# Patient Record
Sex: Female | Born: 1985 | Race: White | Hispanic: No | Smoking: Never smoker
Health system: Southern US, Community
[De-identification: ages and names within clinical notes are randomized; demographics above are authoritative.]

## PROBLEM LIST (undated history)

## (undated) DIAGNOSIS — D649 Anemia, unspecified: Secondary | ICD-10-CM

## (undated) DIAGNOSIS — K589 Irritable bowel syndrome without diarrhea: Secondary | ICD-10-CM

## (undated) DIAGNOSIS — N200 Calculus of kidney: Secondary | ICD-10-CM

## (undated) DIAGNOSIS — E162 Hypoglycemia, unspecified: Secondary | ICD-10-CM

## (undated) HISTORY — PX: CHOLECYSTECTOMY: SHX55

## (undated) HISTORY — PX: TUBAL LIGATION: SHX77

---

## 2004-09-24 ENCOUNTER — Emergency Department: Payer: Self-pay | Admitting: Internal Medicine

## 2004-12-02 ENCOUNTER — Observation Stay: Payer: Self-pay

## 2005-01-29 ENCOUNTER — Observation Stay: Payer: Self-pay | Admitting: Unknown Physician Specialty

## 2005-02-12 ENCOUNTER — Observation Stay: Payer: Self-pay | Admitting: Unknown Physician Specialty

## 2005-03-08 ENCOUNTER — Observation Stay: Payer: Self-pay | Admitting: Obstetrics & Gynecology

## 2005-03-20 ENCOUNTER — Observation Stay: Payer: Self-pay | Admitting: Unknown Physician Specialty

## 2005-03-30 ENCOUNTER — Observation Stay: Payer: Self-pay

## 2005-04-04 ENCOUNTER — Observation Stay: Payer: Self-pay

## 2005-04-09 ENCOUNTER — Observation Stay: Payer: Self-pay | Admitting: Obstetrics & Gynecology

## 2005-04-16 ENCOUNTER — Observation Stay: Payer: Self-pay

## 2005-04-20 ENCOUNTER — Observation Stay: Payer: Self-pay

## 2005-04-21 ENCOUNTER — Observation Stay: Payer: Self-pay

## 2005-04-22 ENCOUNTER — Observation Stay: Payer: Self-pay

## 2005-04-23 ENCOUNTER — Inpatient Hospital Stay: Payer: Self-pay | Admitting: Obstetrics & Gynecology

## 2005-06-28 ENCOUNTER — Emergency Department: Payer: Self-pay | Admitting: Emergency Medicine

## 2005-08-08 ENCOUNTER — Emergency Department: Payer: Self-pay | Admitting: Emergency Medicine

## 2005-09-05 ENCOUNTER — Emergency Department: Payer: Self-pay | Admitting: Emergency Medicine

## 2005-11-20 ENCOUNTER — Ambulatory Visit: Payer: Self-pay | Admitting: Family Medicine

## 2005-12-18 ENCOUNTER — Ambulatory Visit: Payer: Self-pay | Admitting: Family Medicine

## 2005-12-19 ENCOUNTER — Ambulatory Visit (HOSPITAL_COMMUNITY): Admission: RE | Admit: 2005-12-19 | Discharge: 2005-12-19 | Payer: Self-pay | Admitting: Gynecology

## 2005-12-24 ENCOUNTER — Observation Stay: Payer: Self-pay

## 2006-01-15 ENCOUNTER — Ambulatory Visit: Payer: Self-pay | Admitting: Family Medicine

## 2006-01-31 ENCOUNTER — Ambulatory Visit: Payer: Self-pay | Admitting: Obstetrics & Gynecology

## 2006-02-12 ENCOUNTER — Ambulatory Visit: Payer: Self-pay | Admitting: Family Medicine

## 2006-02-26 ENCOUNTER — Ambulatory Visit: Payer: Self-pay | Admitting: Family Medicine

## 2006-03-19 ENCOUNTER — Ambulatory Visit: Payer: Self-pay | Admitting: Obstetrics & Gynecology

## 2006-03-21 ENCOUNTER — Emergency Department: Payer: Self-pay | Admitting: Emergency Medicine

## 2006-03-31 ENCOUNTER — Ambulatory Visit: Payer: Self-pay | Admitting: Obstetrics and Gynecology

## 2006-03-31 ENCOUNTER — Inpatient Hospital Stay (HOSPITAL_COMMUNITY): Admission: AD | Admit: 2006-03-31 | Discharge: 2006-03-31 | Payer: Self-pay | Admitting: Gynecology

## 2006-04-02 ENCOUNTER — Ambulatory Visit: Payer: Self-pay | Admitting: Family Medicine

## 2006-04-11 ENCOUNTER — Observation Stay: Payer: Self-pay | Admitting: Unknown Physician Specialty

## 2006-04-15 ENCOUNTER — Ambulatory Visit: Payer: Self-pay | Admitting: Certified Nurse Midwife

## 2006-04-15 ENCOUNTER — Inpatient Hospital Stay (HOSPITAL_COMMUNITY): Admission: AD | Admit: 2006-04-15 | Discharge: 2006-04-15 | Payer: Self-pay | Admitting: Obstetrics & Gynecology

## 2006-04-16 ENCOUNTER — Ambulatory Visit: Payer: Self-pay | Admitting: Obstetrics & Gynecology

## 2006-04-23 ENCOUNTER — Ambulatory Visit: Payer: Self-pay | Admitting: Gynecology

## 2006-04-30 ENCOUNTER — Ambulatory Visit: Payer: Self-pay | Admitting: Family Medicine

## 2006-05-02 ENCOUNTER — Ambulatory Visit: Payer: Self-pay | Admitting: Family Medicine

## 2006-05-06 ENCOUNTER — Inpatient Hospital Stay (HOSPITAL_COMMUNITY): Admission: AD | Admit: 2006-05-06 | Discharge: 2006-05-06 | Payer: Self-pay | Admitting: Obstetrics & Gynecology

## 2006-05-07 ENCOUNTER — Ambulatory Visit: Payer: Self-pay | Admitting: Family Medicine

## 2006-05-13 ENCOUNTER — Inpatient Hospital Stay (HOSPITAL_COMMUNITY): Admission: AD | Admit: 2006-05-13 | Discharge: 2006-05-13 | Payer: Self-pay | Admitting: Obstetrics and Gynecology

## 2006-05-14 ENCOUNTER — Ambulatory Visit: Payer: Self-pay | Admitting: *Deleted

## 2006-05-14 ENCOUNTER — Inpatient Hospital Stay (HOSPITAL_COMMUNITY): Admission: AD | Admit: 2006-05-14 | Discharge: 2006-05-16 | Payer: Self-pay | Admitting: Obstetrics and Gynecology

## 2006-08-18 ENCOUNTER — Emergency Department: Payer: Self-pay | Admitting: Emergency Medicine

## 2006-10-04 ENCOUNTER — Emergency Department: Payer: Self-pay | Admitting: Emergency Medicine

## 2006-10-11 ENCOUNTER — Encounter (INDEPENDENT_AMBULATORY_CARE_PROVIDER_SITE_OTHER): Payer: Self-pay | Admitting: Specialist

## 2006-10-11 ENCOUNTER — Inpatient Hospital Stay (HOSPITAL_COMMUNITY): Admission: EM | Admit: 2006-10-11 | Discharge: 2006-10-12 | Payer: Self-pay | Admitting: Emergency Medicine

## 2007-01-06 ENCOUNTER — Inpatient Hospital Stay: Payer: Self-pay | Admitting: Psychiatry

## 2007-01-22 ENCOUNTER — Emergency Department: Payer: Self-pay | Admitting: Emergency Medicine

## 2007-02-15 ENCOUNTER — Emergency Department: Payer: Self-pay | Admitting: Emergency Medicine

## 2007-03-11 ENCOUNTER — Inpatient Hospital Stay (HOSPITAL_COMMUNITY): Admission: AD | Admit: 2007-03-11 | Discharge: 2007-03-18 | Payer: Self-pay | Admitting: *Deleted

## 2007-03-11 ENCOUNTER — Ambulatory Visit: Payer: Self-pay | Admitting: *Deleted

## 2007-05-26 ENCOUNTER — Emergency Department: Payer: Self-pay | Admitting: Emergency Medicine

## 2007-05-26 ENCOUNTER — Other Ambulatory Visit: Payer: Self-pay

## 2007-08-20 ENCOUNTER — Emergency Department: Payer: Self-pay | Admitting: Emergency Medicine

## 2007-08-20 ENCOUNTER — Other Ambulatory Visit: Payer: Self-pay

## 2007-12-05 ENCOUNTER — Emergency Department: Payer: Self-pay | Admitting: Emergency Medicine

## 2008-03-12 ENCOUNTER — Inpatient Hospital Stay: Payer: Self-pay | Admitting: Psychiatry

## 2008-07-19 ENCOUNTER — Emergency Department: Payer: Self-pay | Admitting: Emergency Medicine

## 2008-08-20 ENCOUNTER — Inpatient Hospital Stay: Payer: Self-pay | Admitting: Psychiatry

## 2008-10-29 ENCOUNTER — Inpatient Hospital Stay: Payer: Self-pay | Admitting: Unknown Physician Specialty

## 2008-12-05 ENCOUNTER — Emergency Department: Payer: Self-pay | Admitting: Emergency Medicine

## 2008-12-24 ENCOUNTER — Emergency Department: Payer: Self-pay | Admitting: Emergency Medicine

## 2009-02-28 ENCOUNTER — Encounter: Payer: Self-pay | Admitting: Obstetrics & Gynecology

## 2009-03-15 ENCOUNTER — Encounter: Payer: Self-pay | Admitting: Pediatric Cardiology

## 2009-04-14 ENCOUNTER — Observation Stay: Payer: Self-pay | Admitting: Obstetrics and Gynecology

## 2009-04-26 ENCOUNTER — Encounter: Payer: Self-pay | Admitting: Pediatric Cardiology

## 2009-05-12 ENCOUNTER — Emergency Department: Payer: Self-pay | Admitting: Emergency Medicine

## 2009-06-21 ENCOUNTER — Observation Stay: Payer: Self-pay | Admitting: Obstetrics and Gynecology

## 2009-07-05 ENCOUNTER — Observation Stay: Payer: Self-pay | Admitting: Obstetrics and Gynecology

## 2009-07-08 ENCOUNTER — Observation Stay: Payer: Self-pay | Admitting: Obstetrics and Gynecology

## 2009-07-11 ENCOUNTER — Observation Stay: Payer: Self-pay | Admitting: Obstetrics and Gynecology

## 2009-07-12 ENCOUNTER — Observation Stay: Payer: Self-pay | Admitting: Obstetrics and Gynecology

## 2009-07-13 ENCOUNTER — Inpatient Hospital Stay: Payer: Self-pay | Admitting: Obstetrics and Gynecology

## 2009-09-15 ENCOUNTER — Emergency Department: Payer: Self-pay | Admitting: Internal Medicine

## 2009-10-12 ENCOUNTER — Emergency Department: Payer: Self-pay | Admitting: Emergency Medicine

## 2010-03-11 ENCOUNTER — Emergency Department: Payer: Self-pay | Admitting: Internal Medicine

## 2010-09-19 ENCOUNTER — Emergency Department: Payer: Self-pay | Admitting: Emergency Medicine

## 2010-10-23 ENCOUNTER — Emergency Department: Payer: Self-pay | Admitting: Emergency Medicine

## 2011-01-26 NOTE — Op Note (Signed)
NAMEAALIA, GREULICH NO.:  [id number]   MEDICAL RECORD NO.:  [id number]          PATIENT TYPE:  INP   LOCATION:  5011                         FACILITY:  [facility]   PHYSICIAN:  Leonie Man, M.D.   DATE OF BIRTH:  15-Jan-1986   DATE OF PROCEDURE:  10/11/2006  DATE OF DISCHARGE:                               OPERATIVE REPORT   PREOPERATIVE DIAGNOSES:  Biliary colic and cholelithiasis.   POSTOPERATIVE DIAGNOSES:  Biliary colic and cholelithiasis.   PROCEDURE:  Laparoscopic cholecystectomy with intraoperative  cholangiogram.   SURGEON:  Leonie Man, M.D.   ASSISTANT:  Wilmon Arms. Tsuei, M.D.   ANESTHESIA:  General.   SPECIMENS TO THE LAB:  Gallbladder with stones.   ESTIMATED BLOOD LOSS:  Minimal.   COMPLICATIONS:  The patient returned to the PACU in excellent condition.   BRIEF CLINICAL NOTE:  The patient is a 25 year old white female admitted  through the emergency room, complaining of severe epigastric and right  upper quadrant pain with nausea -- but without emesis.  She was  scheduled for laparoscopic cholecystectomy in [place] next Tuesday;  however, the past several days she had been having persistent epigastric  pain and wishes to have something done immediately.  On evaluation a  ultrasound examination faxed from [facility] shows the cholelithiasis  without evidence of gallbladder wall thickening or pericholecystic  fluid.  There was no evidence of biliary distension.  Her vital signs  were stable.  Laboratory evaluations were all within normal limits.  The  risks and potential benefits of surgery were described to the patient.  She understands and gives her consent to same.   PROCEDURE:  The patient was positioned supinely, following the  administration of general endotracheal anesthesia.  The abdomen was  prepped and draped to be included in a sterile operative field.  Open  laparoscopy was created at the umbilicus, with insertion of a  Hassan  cannula.  The peritoneum was inflated to 15 mmHg pressure using carbon  dioxide.  The camera was inserted and the visual exploration carried  out.  Upon inserting the camera the gallbladder appeared to be otherwise  normal, except for a stone within the ampulla  The liver edges were  somewhat rounded.  The liver surfaces were smooth.  There was some  scarring around the duodenum and hepatoduodenal ligament; there was also  some scarring in the pelvis.  Under direct vision, epigastric and  lateral ports were placed.  The gallbladder was grasped and retracted  cephalad.  Dissection the region of the ampulla revealed cystic artery  and cystic duct in the normal anatomic locations.  They were both traced  up to the entry into the gallbladder wall.  The cystic duct was clipped  proximally and opened.  A cystic duct cholangiogram was carried out  using half-strength Hypaque under fluoroscopic control.  The resulting  cholangiogram was normal with no filling defects or free flow of  contrast into the duodenum; normal caliber ducts.  Cholangiocatheter was  removed.  The cystic duct was triply clipped and transected.  The cystic  artery was isolated, doubly clipped  and transected.  The gallbladder was  dissected free from the liver bed using electrocautery, and maintained  hemostasis throughout the course of the dissection.  At the end of the  dissection the liver bed was inspected.  Additional bleeding points were  treated with electrocautery.  The right upper quadrant was thoroughly  irrigated with normal saline.  The gallbladder was placed in a retrieval  pouch and retrieved through the umbilical wound without difficulty.  All  trocars removed under direct vision.  The pneumoperitoneum was released  and wounds closed in layers as follows:  umbilical wound with 0 Dexon  and 4-0 Monocryl; epigastric and flank wounds closed with 4-0 Monocryl.  All wounds were reinforced with Steri-Strips.   Sterile dressings were  applied.  Anesthetic was reversed.  The patient went to the recovery  room in stable condition.  She tolerated the procedure well.      Leonie Man, M.D.  Electronically Signed     PB/MEDQ  D:  10/11/2006  T:  10/11/2006  Job:  [id number]
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
## 2011-03-28 ENCOUNTER — Emergency Department: Payer: Self-pay | Admitting: Emergency Medicine

## 2011-05-12 ENCOUNTER — Emergency Department: Payer: Self-pay | Admitting: Emergency Medicine

## 2011-05-31 ENCOUNTER — Emergency Department: Payer: Self-pay | Admitting: Emergency Medicine

## 2011-06-26 LAB — URINALYSIS, ROUTINE W REFLEX MICROSCOPIC
Bilirubin Urine: NEGATIVE
Glucose, UA: NEGATIVE
Hgb urine dipstick: NEGATIVE
Ketones, ur: NEGATIVE
Protein, ur: NEGATIVE
Specific Gravity, Urine: 1.016
Urobilinogen, UA: 0.2

## 2011-06-26 LAB — CBC: RBC: 4.72

## 2011-06-26 LAB — COMPREHENSIVE METABOLIC PANEL
AST: 16
Albumin: 3.7
Alkaline Phosphatase: 59
Calcium: 9.5
Chloride: 104
Creatinine, Ser: 0.74
Glucose, Bld: 94
Potassium: 3.9
Total Protein: 7.1

## 2011-06-26 LAB — DRUGS OF ABUSE SCREEN W/O ALC, ROUTINE URINE
Amphetamine Screen, Ur: NEGATIVE
Cocaine Metabolites: NEGATIVE
Marijuana Metabolite: NEGATIVE

## 2011-06-26 LAB — TSH: TSH: 1.522

## 2011-08-09 ENCOUNTER — Inpatient Hospital Stay: Payer: Self-pay | Admitting: Psychiatry

## 2011-10-01 ENCOUNTER — Emergency Department: Payer: Self-pay | Admitting: Emergency Medicine

## 2011-10-01 LAB — COMPREHENSIVE METABOLIC PANEL
Albumin: 3.7 g/dL (ref 3.4–5.0)
Alkaline Phosphatase: 46 U/L — ABNORMAL LOW (ref 50–136)
Calcium, Total: 8.5 mg/dL (ref 8.5–10.1)
Co2: 27 mmol/L (ref 21–32)
EGFR (Non-African Amer.): >60
Glucose: 104 mg/dL — ABNORMAL HIGH (ref 65–99)
Osmolality: 290 (ref 275–301)
SGPT (ALT): 19 U/L
Sodium: 146 mmol/L — ABNORMAL HIGH (ref 136–145)

## 2011-10-01 LAB — URINALYSIS, COMPLETE
Bacteria: NONE SEEN
Ketone: NEGATIVE
Nitrite: NEGATIVE
Ph: 6 (ref 4.5–8.0)
Protein: NEGATIVE
Specific Gravity: 1.014 (ref 1.003–1.030)
Squamous Epithelial: 4

## 2011-10-01 LAB — CBC
HCT: 35.9 % (ref 35.0–47.0)
MCHC: 33.9 g/dL (ref 32.0–36.0)
Platelet: 183 10*3/uL (ref 150–440)
RDW: 13.6 % (ref 11.5–14.5)
WBC: 6.7 10*3/uL (ref 3.6–11.0)

## 2011-12-07 ENCOUNTER — Emergency Department: Payer: Self-pay | Admitting: Emergency Medicine

## 2011-12-07 LAB — COMPREHENSIVE METABOLIC PANEL
Alkaline Phosphatase: 59 U/L (ref 50–136)
Bilirubin,Total: 0.4 mg/dL (ref 0.2–1.0)
Calcium, Total: 8.8 mg/dL (ref 8.5–10.1)
Co2: 25 mmol/L (ref 21–32)
EGFR (Non-African Amer.): >60
Glucose: 88 mg/dL (ref 65–99)
Osmolality: 282 (ref 275–301)
SGOT(AST): 19 U/L (ref 15–37)
SGPT (ALT): 19 U/L
Sodium: 142 mmol/L (ref 136–145)
Total Protein: 7.4 g/dL (ref 6.4–8.2)

## 2011-12-07 LAB — URINALYSIS, COMPLETE
Bilirubin,UR: NEGATIVE
Blood: NEGATIVE
Glucose,UR: NEGATIVE mg/dL (ref 0–75)
Leukocyte Esterase: NEGATIVE
Nitrite: NEGATIVE
Protein: NEGATIVE
RBC,UR: 2 /HPF (ref 0–5)
Specific Gravity: 1.014 (ref 1.003–1.030)
Squamous Epithelial: 3
WBC UR: 4 /HPF (ref 0–5)

## 2011-12-07 LAB — CBC
MCH: 28.5 pg (ref 26.0–34.0)
MCHC: 33.4 g/dL (ref 32.0–36.0)
MCV: 85 fL (ref 80–100)
Platelet: 216 10*3/uL (ref 150–440)
RBC: 4.55 10*6/uL (ref 3.80–5.20)
RDW: 12.7 % (ref 11.5–14.5)

## 2011-12-07 LAB — DRUG SCREEN, URINE
Amphetamines, Ur Screen: NEGATIVE (ref ?–1000)
Benzodiazepine, Ur Scrn: NEGATIVE (ref ?–200)
Cannabinoid 50 Ng, Ur ~~LOC~~: NEGATIVE (ref ?–50)
Cocaine Metabolite,Ur ~~LOC~~: NEGATIVE (ref ?–300)
Methadone, Ur Screen: NEGATIVE (ref ?–300)
Opiate, Ur Screen: NEGATIVE (ref ?–300)
Phencyclidine (PCP) Ur S: NEGATIVE (ref ?–25)

## 2011-12-07 LAB — TROPONIN I: Troponin-I: <0.02 ng/mL

## 2011-12-07 LAB — LITHIUM LEVEL: Lithium: <0.2 mmol/L — ABNORMAL LOW

## 2012-01-15 ENCOUNTER — Inpatient Hospital Stay: Payer: Self-pay | Admitting: Internal Medicine

## 2012-01-15 LAB — DRUG SCREEN, URINE
Amphetamines, Ur Screen: NEGATIVE (ref ?–1000)
Barbiturates, Ur Screen: NEGATIVE (ref ?–200)
Cannabinoid 50 Ng, Ur ~~LOC~~: NEGATIVE (ref ?–50)
Cocaine Metabolite,Ur ~~LOC~~: NEGATIVE (ref ?–300)
MDMA (Ecstasy)Ur Screen: NEGATIVE (ref ?–500)
Tricyclic, Ur Screen: NEGATIVE (ref ?–1000)

## 2012-01-15 LAB — SALICYLATE LEVEL: Salicylates, Serum: <1.7 mg/dL

## 2012-01-15 LAB — URINALYSIS, COMPLETE
Bacteria: NONE SEEN
Blood: NEGATIVE
Ketone: NEGATIVE
Leukocyte Esterase: NEGATIVE
Protein: NEGATIVE

## 2012-01-15 LAB — COMPREHENSIVE METABOLIC PANEL
Albumin: 3.9 g/dL (ref 3.4–5.0)
Alkaline Phosphatase: 54 U/L (ref 50–136)
BUN: 12 mg/dL (ref 7–18)
Bilirubin,Total: 0.3 mg/dL (ref 0.2–1.0)
Calcium, Total: 8.5 mg/dL (ref 8.5–10.1)
Chloride: 109 mmol/L — ABNORMAL HIGH (ref 98–107)
Creatinine: 0.58 mg/dL — ABNORMAL LOW (ref 0.60–1.30)
EGFR (African American): >60
Potassium: 3.8 mmol/L (ref 3.5–5.1)
Sodium: 141 mmol/L (ref 136–145)

## 2012-01-15 LAB — BASIC METABOLIC PANEL
Anion Gap: 5 — ABNORMAL LOW (ref 7–16)
BUN: 6 mg/dL — ABNORMAL LOW (ref 7–18)
Co2: 27 mmol/L (ref 21–32)
Potassium: 4.3 mmol/L (ref 3.5–5.1)

## 2012-01-15 LAB — CBC
MCV: 84 fL (ref 80–100)
RDW: 13.5 % (ref 11.5–14.5)

## 2012-01-15 LAB — ETHANOL
Ethanol %: <0.003 % (ref 0.000–0.080)
Ethanol: <3 mg/dL

## 2012-01-15 LAB — LITHIUM LEVEL
Lithium: 1.39 mmol/L — ABNORMAL HIGH
Lithium: 0.74 mmol/L

## 2012-01-16 ENCOUNTER — Inpatient Hospital Stay: Payer: Self-pay | Admitting: Psychiatry

## 2012-01-17 LAB — BEHAVIORAL MEDICINE 1 PANEL
BUN: 13 mg/dL (ref 7–18)
Bilirubin,Total: 0.4 mg/dL (ref 0.2–1.0)
Co2: 27 mmol/L (ref 21–32)
Creatinine: 0.76 mg/dL (ref 0.60–1.30)
Glucose: 85 mg/dL (ref 65–99)
Lymphocyte #: 2.2 10*3/uL (ref 1.0–3.6)
Lymphocyte %: 26.4 %
Monocyte #: 0.6 x10 3/mm (ref 0.2–0.9)
Neutrophil %: 61.6 %
SGPT (ALT): 15 U/L
Sodium: 142 mmol/L (ref 136–145)
Total Protein: 7.3 g/dL (ref 6.4–8.2)

## 2012-01-20 LAB — LIPID PANEL
Cholesterol: 91 mg/dL (ref 0–200)
HDL Cholesterol: 27 mg/dL — ABNORMAL LOW (ref 40–60)
Triglycerides: 50 mg/dL (ref 0–200)

## 2012-01-21 LAB — COMPREHENSIVE METABOLIC PANEL
Albumin: 3.6 g/dL (ref 3.4–5.0)
Calcium, Total: 8.6 mg/dL (ref 8.5–10.1)
Co2: 27 mmol/L (ref 21–32)
Creatinine: 0.67 mg/dL (ref 0.60–1.30)
EGFR (African American): >60
EGFR (Non-African Amer.): >60
Osmolality: 278 (ref 275–301)
Potassium: 3.9 mmol/L (ref 3.5–5.1)
SGOT(AST): 12 U/L — ABNORMAL LOW (ref 15–37)
SGPT (ALT): 15 U/L

## 2012-02-04 ENCOUNTER — Emergency Department: Payer: Self-pay | Admitting: Emergency Medicine

## 2012-02-08 ENCOUNTER — Emergency Department: Payer: Self-pay | Admitting: Internal Medicine

## 2012-02-22 ENCOUNTER — Emergency Department: Payer: Self-pay | Admitting: Emergency Medicine

## 2012-02-22 LAB — HCG, QUANTITATIVE, PREGNANCY: Beta Hcg, Quant.: <1 m[IU]/mL — ABNORMAL LOW

## 2012-02-22 LAB — WET PREP, GENITAL

## 2012-10-13 ENCOUNTER — Emergency Department: Payer: Self-pay | Admitting: Unknown Physician Specialty

## 2012-10-13 LAB — RAPID INFLUENZA A&B ANTIGENS

## 2012-10-13 LAB — COMPREHENSIVE METABOLIC PANEL
Bilirubin,Total: 0.2 mg/dL (ref 0.2–1.0)
Creatinine: 0.77 mg/dL (ref 0.60–1.30)
EGFR (African American): >60
EGFR (Non-African Amer.): >60
Osmolality: 279 (ref 275–301)
SGOT(AST): 17 U/L (ref 15–37)
Sodium: 140 mmol/L (ref 136–145)

## 2012-10-13 LAB — CBC
HCT: 36.7 % (ref 35.0–47.0)
HGB: 12.2 g/dL (ref 12.0–16.0)
MCHC: 33.2 g/dL (ref 32.0–36.0)
MCV: 84 fL (ref 80–100)
Platelet: 213 10*3/uL (ref 150–440)
RBC: 4.39 10*6/uL (ref 3.80–5.20)
RDW: 13.5 % (ref 11.5–14.5)

## 2012-10-13 LAB — URINALYSIS, COMPLETE
Bacteria: NONE SEEN
Blood: NEGATIVE
Glucose,UR: NEGATIVE mg/dL (ref 0–75)
Ketone: NEGATIVE
Leukocyte Esterase: NEGATIVE
Ph: 8 (ref 4.5–8.0)
RBC,UR: <1 /HPF (ref 0–5)
Squamous Epithelial: 2
WBC UR: <1 /HPF (ref 0–5)

## 2012-11-08 ENCOUNTER — Emergency Department: Payer: Self-pay | Admitting: Emergency Medicine

## 2012-11-20 ENCOUNTER — Emergency Department: Payer: Self-pay | Admitting: Emergency Medicine

## 2012-11-20 LAB — COMPREHENSIVE METABOLIC PANEL
Anion Gap: 8 (ref 7–16)
Bilirubin,Total: 0.2 mg/dL (ref 0.2–1.0)
Calcium, Total: 8.4 mg/dL — ABNORMAL LOW (ref 8.5–10.1)
Chloride: 107 mmol/L (ref 98–107)
Co2: 24 mmol/L (ref 21–32)
Creatinine: 0.71 mg/dL (ref 0.60–1.30)
EGFR (African American): >60
EGFR (Non-African Amer.): >60
Glucose: 88 mg/dL (ref 65–99)
Osmolality: 277 (ref 275–301)
Potassium: 3.8 mmol/L (ref 3.5–5.1)
Sodium: 139 mmol/L (ref 136–145)
Total Protein: 7.2 g/dL (ref 6.4–8.2)

## 2012-11-20 LAB — CBC WITH DIFFERENTIAL/PLATELET
Basophil #: 0.1 10*3/uL (ref 0.0–0.1)
HGB: 12.4 g/dL (ref 12.0–16.0)
Lymphocyte #: 1.9 10*3/uL (ref 1.0–3.6)
Lymphocyte %: 27.3 %
MCH: 27.4 pg (ref 26.0–34.0)
MCV: 83 fL (ref 80–100)
Monocyte #: 0.5 x10 3/mm (ref 0.2–0.9)
Monocyte %: 7.6 %
Neutrophil #: 4.3 10*3/uL (ref 1.4–6.5)
Neutrophil %: 62.4 %
RDW: 13.2 % (ref 11.5–14.5)

## 2012-11-20 LAB — URINALYSIS, COMPLETE
Nitrite: NEGATIVE
Ph: 6 (ref 4.5–8.0)
Protein: NEGATIVE
Squamous Epithelial: 5
WBC UR: 1 /HPF (ref 0–5)

## 2013-10-06 ENCOUNTER — Emergency Department: Payer: Self-pay | Admitting: Emergency Medicine

## 2014-11-07 ENCOUNTER — Emergency Department: Payer: Self-pay | Admitting: Emergency Medicine

## 2015-01-02 NOTE — Discharge Summary (Signed)
PATIENT NAME:  Alexis [name], Alexis [name] MR#:  [id number] DATE OF BIRTH:  December 15, 1985  DATE OF ADMISSION:  01/15/2012 DATE OF DISCHARGE:  01/16/2012   [facility]   PRIMARY DOCTOR: None.   DISCHARGE DIAGNOSES:  1. Multidrug overdose with suicidal attempt. 2. Lithium toxicity. 3. Depression.  4. Gastroesophageal reflux disease. 5. Migraine headaches.   MEDICATIONS: Right now she is only on IV fluids and no other medications. The medications that are to be restarted will be decided by psychiatrist.   CONSULTATION: Psychiatry with Dr. Arlyss RepressJames Williford    HOSPITAL COURSE: The patient is [name] 29 year old female with history of depression before on Effexor, Klonopin, lithium, and Seroquel at home who was brought in by roommate because of concern for overdose. The patient might have taken 30 tablets of Effexor, 30 tablets of Klonopin, and also 5 to 10 tablets of lithium. Effexor tablet strength is 75 mg daily, Klonopin 0.5 mg, and lithium 5 to 10 tablets; each tablet is 200 mg. The patient acknowledged that she was trying to kill herself and was taking overdose on and off for several months. The patient was admitted to the hospitalist service in Critical Care Unit because her lithium level was 1.39 on admission and Poison Control Center recommended to monitor her in ICU. She was started on IV fluids along with IVC and one-to-one precautions. Her EKG was normal sinus rhythm, no ST-T changes. Urine toxicology was negative. Her creatinine was 0.5 when she came. The patient received charcoal in the Emergency Room and had an NG tube placed and transferred to ICU. In ICU she received IV fluids and became more alert. NG tube was discontinued and she was started on diet. Initially she was on liquids and we advanced her diet to [name] regular diet last evening. The patient tolerated the diet and her repeat level of lithium was slightly elevated at 1.43 on second level  and then the repeat was 0.74. That was the third lithium level. The patient was maintained on seizure precautions for 24 hours because she also overdosed on Effexor. The patient did not have any seizures. She was calm and oriented. The patient's condition is stable. She is still having some suicidal thoughts. She has [name] Comptrollersitter at the bedside and she is medically cleared to admit for inpatient psych evaluation. Hold on psychotropic medications and follow recommendations of psychiatrist.  VITALS AT THIS TIME: Temperature 98.5, pulse 55, respirations 17, blood pressure this morning 110/57, sats 98%.   OTHER LAB DATA: Her EKG on admission normal sinus, 69 beats per minute. Lithium 1.39. TSH 1.95. Salicylates less than 1.7. WBC 6.6, hemoglobin 12.6, hematocrit 37.4, platelets 185. Electrolytes sodium 141, potassium 3.8, chloride 109, bicarb 26, BUN 12, creatinine 0.58, glucose 93. Liver functions within normal limits. ABG pH 7.36, pCO2 44, pO2 158, sats 99.4%. Pregnancy test negative. The patient's urinalysis is clean. No leukocyte esterase. No bacteria. Urine toxicology is negative. Chest x-ray showed no acute disease. Lithium level repeat was 1.43 and third lithium level 0.74.   DISPOSITION: The patient is stable for transfer to Behavioral Medicine Unit.  TIME SPENT ON DISCHARGE PREPARATION: More than 30 minutes.  ____________________________ Katha HammingSnehalatha Lynann Demetrius, MD sk:drc D: 01/16/2012 09:29:46 ET T: 01/16/2012 13:43:53 ET JOB#: [id number]  cc: Katha HammingSnehalatha Almena Hokenson, MD, <Dictator> Katha HammingSNEHALATHA Leray Garverick MD ELECTRONICALLY SIGNED 01/23/2012 22:54
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
## 2015-05-30 ENCOUNTER — Encounter: Payer: Self-pay | Admitting: Emergency Medicine

## 2015-05-30 ENCOUNTER — Emergency Department
Admission: EM | Admit: 2015-05-30 | Discharge: 2015-05-30 | Disposition: A | Discharge status: Home / Self Care | Payer: Self-pay | Attending: Emergency Medicine | Admitting: Emergency Medicine

## 2015-05-30 DIAGNOSIS — N390 Urinary tract infection, site not specified: Secondary | ICD-10-CM | POA: Insufficient documentation

## 2015-05-30 LAB — URINALYSIS COMPLETE WITH MICROSCOPIC (ARMC ONLY)
BILIRUBIN URINE: NEGATIVE
GLUCOSE, UA: NEGATIVE mg/dL
Ketones, ur: NEGATIVE mg/dL
NITRITE: NEGATIVE
Protein, ur: 100 mg/dL — AB
SPECIFIC GRAVITY, URINE: 1.025 (ref 1.005–1.030)
Trans Epithel, UA: 2
pH: 5 (ref 5.0–8.0)

## 2015-05-30 MED ORDER — CIPROFLOXACIN HCL 500 MG PO TABS: 500.0000 mg | ORAL_TABLET | Freq: Two times a day (BID) | ORAL | Status: DC

## 2015-05-30 NOTE — Discharge Instructions (Signed)

## 2015-05-30 NOTE — ED Provider Notes (Signed)
[facility] Emergency Department Provider Note  ____________________________________________  Time seen: On arrival  I have reviewed the triage vital signs and the nursing notes.   HISTORY  Chief Complaint Urinary Frequency    HPI Alexis [name] is a 29 y.o. female who presents with complaints of urinary frequency, dysuria for approximately 24 hours. She has tried treating for yeast infection with no improvement. She denies vaginal discharge. She is recent check for STDs as negative.She denies fevers chills. No flank pain. No pelvic discomfort except when urinating    History reviewed. No pertinent past medical history.  There are no active problems to display for this patient.   History reviewed. No pertinent past surgical history.  Current Outpatient Rx  Name  Route  Sig  Dispense  Refill  . ciprofloxacin (CIPRO) 500 MG tablet   Oral   Take 1 tablet (500 mg total) by mouth 2 (two) times daily.   14 tablet   0     Allergies Morphine and related  History reviewed. No pertinent family history.  Social History Social History  Substance Use Topics  . Smoking status: Never Smoker   . Smokeless tobacco: None  . Alcohol Use: None    Review of Systems  Constitutional: Negative for fever. Eyes: Negative for discharge ENT: Negative for sore throat Genitourinary: Positive for dysuria Musculoskeletal: Negative for back pain. Skin: Negative for rash. Neurological: Negative for headaches    ____________________________________________   PHYSICAL EXAM:  VITAL SIGNS: ED Triage Vitals  Enc Vitals Group     BP 05/30/15 0928 110/62 mmHg     Pulse Rate 05/30/15 0928 74     Resp 05/30/15 0928 18     Temp 05/30/15 0928 97.9 F (36.6 C)     Temp Source 05/30/15 0928 Oral     SpO2 05/30/15 0928 99 %     Weight 05/30/15 1023 170 lb (77.111 kg)     Height 05/30/15 1023  (1.626 m)     Head Cir --      Peak Flow --      Pain  Score 05/30/15 0920 5     Pain Loc --      Pain Edu? --      Excl. in GC? --      Constitutional: Alert and oriented. Well appearing and in no distress. Eyes: Conjunctivae are normal.  ENT   Head: Normocephalic and atraumatic.   Mouth/Throat: Mucous membranes are moist. Cardiovascular: Normal rate, regular rhythm.  Respiratory: Normal respiratory effort without tachypnea nor retractions.  Gastrointestinal: Soft and non-tender in all quadrants. No distention. There is no CVA tenderness. Musculoskeletal: Nontender with normal range of motion in all extremities. Neurologic:  Normal speech and language. No gross focal neurologic deficits are appreciated. Skin:  Skin is warm, dry and intact. No rash noted. Psychiatric: Mood and affect are normal. Patient exhibits appropriate insight and judgment.  ____________________________________________    LABS (pertinent positives/negatives)  Labs Reviewed  URINALYSIS COMPLETEWITH MICROSCOPIC ([facility] ONLY) - Abnormal; Notable for the following:    Color, Urine YELLOW (*)    APPearance CLOUDY (*)    Hgb urine dipstick 3+ (*)    Protein, ur 100 (*)    Leukocytes, UA 3+ (*)    Bacteria, UA FEW (*)    Squamous Epithelial / LPF 6-30 (*)    All other components within normal limits    ____________________________________________     ____________________________________________    RADIOLOGY I have personally reviewed  any xrays that were ordered on this patient: None  ____________________________________________   PROCEDURES  Procedure(s) performed: none   ____________________________________________   INITIAL IMPRESSION / ASSESSMENT AND PLAN / ED COURSE  Pertinent labs & imaging results that were available during my care of the patient were reviewed by me and considered in my medical decision making (see chart for details).  Patient well-appearing. Vitals are normal. Afebrile. Benign exam. History of present illness  most consistent with urinary tract infection. UA c/w UTI. No flank pain We'll check urine and if positive will treat with antibiotics  ____________________________________________   FINAL CLINICAL IMPRESSION(S) / ED DIAGNOSES  Final diagnoses:  UTI (lower urinary tract infection)     Jene Every, MD 05/30/15 1037

## 2015-05-30 NOTE — ED Notes (Signed)
Reports burning with urination and urinary frequency

## 2015-09-01 ENCOUNTER — Encounter: Payer: Self-pay | Admitting: Emergency Medicine

## 2015-09-01 ENCOUNTER — Emergency Department
Admission: EM | Admit: 2015-09-01 | Discharge: 2015-09-01 | Disposition: A | Discharge status: Home / Self Care | Payer: Self-pay | Attending: Student | Admitting: Student

## 2015-09-01 DIAGNOSIS — B349 Viral infection, unspecified: Secondary | ICD-10-CM | POA: Insufficient documentation

## 2015-09-01 DIAGNOSIS — Z792 Long term (current) use of antibiotics: Secondary | ICD-10-CM | POA: Insufficient documentation

## 2015-09-01 NOTE — ED Provider Notes (Signed)
[facility] Emergency Department Provider Note  ____________________________________________  Time seen: Approximately 7:07 AM  I have reviewed the triage vital signs and the nursing notes.   HISTORY  Chief Complaint Generalized Body Aches and Chills    HPI Alexis [name] is a 29 y.o. female with no chronic medical problems who presents for evaluation of one day runny nose, nausea, chills, gradual onset, intermittent, currently mild, no modifying factors. Patient reports that yesterday she developed some runny nose. Throughout the night she developed chills and nausea. She did not check her temperature so she cannot attest to fevers. She's had no vomiting or diarrhea, no chest pain or abdominal pain. No pain or burning with urination. No headache, no neck pain or neck stiffness.   History reviewed. No pertinent past medical history.  There are no active problems to display for this patient.   Past Surgical History  Procedure Laterality Date  . Tubal ligation    . Cholecystectomy      Current Outpatient Rx  Name  Route  Sig  Dispense  Refill  . ciprofloxacin (CIPRO) 500 MG tablet   Oral   Take 1 tablet (500 mg total) by mouth 2 (two) times daily.   14 tablet   0     Allergies Morphine and related  No family history on file.  Social History Social History  Substance Use Topics  . Smoking status: Never Smoker   . Smokeless tobacco: None  . Alcohol Use: No    Review of Systems Constitutional: No fever, +chills Eyes: No visual changes. ENT: No sore throat. Cardiovascular: Denies chest pain. Respiratory: Denies shortness of breath. Gastrointestinal: No abdominal pain.  + nausea, no vomiting.  No diarrhea.  No constipation. Genitourinary: Negative for dysuria. Musculoskeletal: Negative for back pain. Skin: Negative for rash. Neurological: Negative for headaches, focal weakness or numbness.  10-point ROS otherwise  negative.  ____________________________________________   PHYSICAL EXAM:  VITAL SIGNS: ED Triage Vitals  Enc Vitals Group     BP 09/01/15 0536 116/60 mmHg     Pulse Rate 09/01/15 0536 69     Resp 09/01/15 0536 18     Temp 09/01/15 0536 98.3 F (36.8 C)     Temp Source 09/01/15 0536 Oral     SpO2 09/01/15 0536 99 %     Weight 09/01/15 0536 175 lb (79.379 kg)     Height 09/01/15 0536 5\' 9"  (1.753 m)     Head Cir --      Peak Flow --      Pain Score 09/01/15 0535 3     Pain Loc --      Pain Edu? --      Excl. in GC? --     Constitutional: Alert and oriented. Well appearing and in no acute distress. Eyes: Conjunctivae are normal. PERRL. EOMI. Head: Atraumatic. Nose: No congestion/rhinnorhea. Mouth/Throat: Mucous membranes are moist.  Oropharynx non-erythematous. Neck: No stridor.   Cardiovascular: Normal rate, regular rhythm. Grossly normal heart sounds.  Good peripheral circulation. Respiratory: Normal respiratory effort.  No retractions. Lungs CTAB. Gastrointestinal: Soft and nontender. No distention.  No CVA tenderness. Genitourinary: deferred Musculoskeletal: No lower extremity tenderness nor edema.  No joint effusions. Neurologic:  Normal speech and language. No gross focal neurologic deficits are appreciated. No gait instability. Skin:  Skin is warm, dry and intact. No rash noted. Psychiatric: Mood and affect are normal. Speech and behavior are normal.  ____________________________________________   LABS (all labs ordered are listed,  but only abnormal results are displayed)  Labs Reviewed - No data to display ____________________________________________  EKG  none ____________________________________________  RADIOLOGY  none ____________________________________________   PROCEDURES  Procedure(s) performed: None  Critical Care performed: No  ____________________________________________   INITIAL IMPRESSION / ASSESSMENT AND PLAN / ED  COURSE  Pertinent labs & imaging results that were available during my care of the patient were reviewed by me and considered in my medical decision making (see chart for details).  Alexis [name] is a 29 y.o. female with no chronic medical problems who presents for evaluation of one day runny nose, nausea, chills. On exam, she is well-appearing and in no acute distress. Vital signs are stable, she is afebrile. She has a benign physical examination. Suspect early onset viral syndrome. Doubt any serious bacterial infection in this very well-appearing young woman. Discussed return precautions, symptomatic support, need for PCP follow-up. She is comfortable with the discharge plan. She is requesting a work note for today which I provided.  ____________________________________________   FINAL CLINICAL IMPRESSION(S) / ED DIAGNOSES  Final diagnoses:  Viral syndrome      Gayla Doss, MD 09/01/15 [phone]

## 2015-09-01 NOTE — ED Notes (Signed)
Patient ambulatory to triage with steady gait, without difficulty or distress noted; pt reports body aches, chills since 230am

## 2015-11-18 ENCOUNTER — Encounter: Payer: Self-pay | Admitting: Medical Oncology

## 2015-11-18 ENCOUNTER — Emergency Department
Admission: EM | Admit: 2015-11-18 | Discharge: 2015-11-18 | Disposition: A | Discharge status: Home / Self Care | Payer: Self-pay | Attending: Emergency Medicine | Admitting: Emergency Medicine

## 2015-11-18 DIAGNOSIS — Z792 Long term (current) use of antibiotics: Secondary | ICD-10-CM | POA: Insufficient documentation

## 2015-11-18 DIAGNOSIS — J3089 Other allergic rhinitis: Secondary | ICD-10-CM | POA: Insufficient documentation

## 2015-11-18 DIAGNOSIS — J069 Acute upper respiratory infection, unspecified: Secondary | ICD-10-CM | POA: Insufficient documentation

## 2015-11-18 MED ORDER — FLUTICASONE PROPIONATE 50 MCG/ACT NA SUSP: 2.0000 | Freq: Every day | NASAL | Status: DC

## 2015-11-18 MED ORDER — BENZONATATE 100 MG PO CAPS: 100.0000 mg | ORAL_CAPSULE | Freq: Three times a day (TID) | ORAL | Status: DC | PRN

## 2015-11-18 NOTE — ED Provider Notes (Signed)
[facility] Emergency Department Provider Note ____________________________________________  Time seen: 1007  I have reviewed the triage vital signs and the nursing notes.  HISTORY  Chief Complaint  Cough and Chills  HPI Alexis [name] is a 30 y.o. female is to the ED for evaluation of 4 days worth of nonproductive cough, chills, and cold symptoms. She denies any fevers or shortness of breath. She's been dosing NyQuil in the evenings for symptom management. She also admits to dosing 2 days worth of amoxicillin from a prescription given to her by her husband. She denies any benefit or any change in his symptoms overall. She describes runny nose, postnasal drainage, and intermittent cough. She notes some chest discomfort associated with the cough. She rates his overall, generalized discomfort at 3/10 in triage.  History reviewed. No pertinent past medical history.  There are no active problems to display for this patient.   Past Surgical History  Procedure Laterality Date  . Tubal ligation    . Cholecystectomy      Current Outpatient Rx  Name  Route  Sig  Dispense  Refill  . benzonatate (TESSALON PERLES) 100 MG capsule   Oral   Take 1 capsule (100 mg total) by mouth 3 (three) times daily as needed for cough (Take 1-2 per dose).   30 capsule   0   . ciprofloxacin (CIPRO) 500 MG tablet   Oral   Take 1 tablet (500 mg total) by mouth 2 (two) times daily.   14 tablet   0   . fluticasone (FLONASE) 50 MCG/ACT nasal spray   Each Nare   Place 2 sprays into both nostrils daily.   16 g   0    Allergies Morphine and related  No family history on file.  Social History Social History  Substance Use Topics  . Smoking status: Never Smoker   . Smokeless tobacco: None  . Alcohol Use: No   Review of Systems  Constitutional: Negative for fever. Eyes: Negative for visual changes. ENT: Negative for sore throat. Reports cough and  congestion Cardiovascular: Negative for chest pain. Respiratory: Negative for shortness of breath. Gastrointestinal: Negative for abdominal pain, vomiting and diarrhea. Musculoskeletal: Negative for back pain. Skin: Negative for rash. Neurological: Negative for headaches, focal weakness or numbness. ____________________________________________  PHYSICAL EXAM:  VITAL SIGNS: ED Triage Vitals  Enc Vitals Group     BP 11/18/15 0849 116/57 mmHg     Pulse Rate 11/18/15 0849 63     Resp 11/18/15 0849 18     Temp 11/18/15 0849 98.3 F (36.8 C)     Temp Source 11/18/15 0849 Oral     SpO2 11/18/15 0849 98 %     Weight 11/18/15 0849 195 lb (88.451 kg)     Height 11/18/15 0849  (1.753 m)     Head Cir --      Peak Flow --      Pain Score 11/18/15 0850 3     Pain Loc --      Pain Edu? --      Excl. in GC? --    Constitutional: Alert and oriented. Well appearing and in no distress. Head: Normocephalic and atraumatic.      Eyes: Conjunctivae are normal. PERRL. Normal extraocular movements      Ears: Canals clear. TMs intact bilaterally.   Nose: Mild nasal congestion. Clear rhinorrhea.   Mouth/Throat: Mucous membranes are moist. Uvula is midline and tonsils are flat without erythema, edema, or  exudate.   Neck: Supple. No thyromegaly. Hematological/Lymphatic/Immunological: No cervical lymphadenopathy. Cardiovascular: Normal rate, regular rhythm.  Respiratory: Normal respiratory effort. No wheezes/rales/rhonchi. Gastrointestinal: Soft and nontender. No distention. Musculoskeletal: Nontender with normal range of motion in all extremities.  Neurologic:  Normal gait without ataxia. Normal speech and language. No gross focal neurologic deficits are appreciated. Skin:  Skin is warm, dry and intact. No rash noted. Psychiatric: Mood and affect are normal. Patient exhibits appropriate insight and judgment. ____________________________________________  INITIAL IMPRESSION / ASSESSMENT  AND PLAN / ED COURSE  Patient with rhinitis symptoms likely due to an allergic etiology at best, and a viral etiology or worse. She is advised that she will likely get no benefit from continuing the amoxicillin she started. She is advised at a daytime cough medicine to her regimen as well as a daily allergy medicine. She'll be provided prescriptions for Flonase and Tessalon Perles dose as directed. She is advised to return to the ED or relative [facility] for worsening symptoms. ____________________________________________  FINAL CLINICAL IMPRESSION(S) / ED DIAGNOSES  Final diagnoses:  URI (upper respiratory infection)  Other allergic rhinitis     Lissa HoardJenise V Bacon Aliani Caccavale, PA-C 11/18/15 1057  Emily FilbertJonathan E Williams, MD 11/18/15 1128

## 2015-11-18 NOTE — ED Notes (Signed)
Patient c/o chills, cough, nausea, generalized weakness, body aches for 4 days. States "everyone at work is sick"

## 2015-11-18 NOTE — Discharge Instructions (Signed)
Allergic Rhinitis Allergic rhinitis is when the mucous membranes in the nose respond to allergens. Allergens are particles in the air that cause your body to have an allergic reaction. This causes you to release allergic antibodies. Through a chain of events, these eventually cause you to release histamine into the blood stream. Although meant to protect the body, it is this release of histamine that causes your discomfort, such as frequent sneezing, congestion, and an itchy, runny nose.  CAUSES Seasonal allergic rhinitis (hay fever) is caused by pollen allergens that may come from grasses, trees, and weeds. Year-round allergic rhinitis (perennial allergic rhinitis) is caused by allergens such as house dust mites, pet dander, and mold spores. SYMPTOMS  Nasal stuffiness (congestion).  Itchy, runny nose with sneezing and tearing of the eyes. DIAGNOSIS Your health care provider can help you determine the allergen or allergens that trigger your symptoms. If you and your health care provider are unable to determine the allergen, skin or blood testing may be used. Your health care provider will diagnose your condition after taking your health history and performing a physical exam. Your health care provider may assess you for other related conditions, such as asthma, pink eye, or an ear infection. TREATMENT Allergic rhinitis does not have a cure, but it can be controlled by:  Medicines that block allergy symptoms. These may include allergy shots, nasal sprays, and oral antihistamines.  Avoiding the allergen. Hay fever may often be treated with antihistamines in pill or nasal spray forms. Antihistamines block the effects of histamine. There are over-the-counter medicines that may help with nasal congestion and swelling around the eyes. Check with your health care provider before taking or giving this medicine. If avoiding the allergen or the medicine prescribed do not work, there are many new medicines  your health care provider can prescribe. Stronger medicine may be used if initial measures are ineffective. Desensitizing injections can be used if medicine and avoidance does not work. Desensitization is when a patient is given ongoing shots until the body becomes less sensitive to the allergen. Make sure you follow up with your health care provider if problems continue. HOME CARE INSTRUCTIONS It is not possible to completely avoid allergens, but you can reduce your symptoms by taking steps to limit your exposure to them. It helps to know exactly what you are allergic to so that you can avoid your specific triggers. SEEK MEDICAL CARE IF:  You have a fever.  You develop a cough that does not stop easily (persistent).  You have shortness of breath.  You start wheezing.  Symptoms interfere with normal daily activities.   This information is not intended to replace advice given to you by your health care provider. Make sure you discuss any questions you have with your health care provider.   Document Released: 05/22/2001 Document Revised: 09/17/2014 Document Reviewed: 05/04/2013 Elsevier Interactive Patient Education 2016 Elsevier Inc[employer]  Viral Infections A virus is a type of germ. Viruses can cause:  Minor sore throats.  Aches and pains.  Headaches.  Runny nose.  Rashes.  Watery eyes.  Tiredness.  Coughs.  Loss of appetite.  Feeling sick to your stomach (nausea).  Throwing up (vomiting).  Watery poop (diarrhea). HOME CARE   Only take medicines as told by your doctor.  Drink enough water and fluids to keep your pee (urine) clear or pale yellow. Sports drinks are a good choice.  Get plenty of rest and eat healthy. Soups and broths with crackers or rice are  fine. GET HELP RIGHT AWAY IF:   You have a very bad headache.  You have shortness of breath.  You have chest pain or neck pain.  You have an unusual rash.  You cannot stop throwing up.  You have watery  poop that does not stop.  You cannot keep fluids down.  You or your child has a temperature by mouth above 102 F (38.9 C), not controlled by medicine.  Your baby is older than 3 months with a rectal temperature of 102 F (38.9 C) or higher.  Your baby is 593 months old or younger with a rectal temperature of 100.4 F (38 C) or higher. MAKE SURE YOU:   Understand these instructions.  Will watch this condition.  Will get help right away if you are not doing well or get worse.   This information is not intended to replace advice given to you by your health care provider. Make sure you discuss any questions you have with your health care provider.   Document Released: 08/09/2008 Document Revised: 11/19/2011 Document Reviewed: 02/02/2015 Elsevier Interactive Patient Education 2016 [employer]   Take the prescription meds as directed. Continue your NyQuil and start a daytime cough medicine like Delsym or Robitussin. [facility].

## 2015-11-18 NOTE — ED Notes (Signed)
Cold sx's with chills and cough x 4 days.

## 2016-01-12 ENCOUNTER — Encounter: Payer: Self-pay | Admitting: Emergency Medicine

## 2016-01-12 ENCOUNTER — Emergency Department: Payer: Self-pay

## 2016-01-12 ENCOUNTER — Emergency Department
Admission: EM | Admit: 2016-01-12 | Discharge: 2016-01-12 | Disposition: A | Discharge status: Home / Self Care | Payer: Self-pay | Attending: Emergency Medicine | Admitting: Emergency Medicine

## 2016-01-12 DIAGNOSIS — R1012 Left upper quadrant pain: Secondary | ICD-10-CM | POA: Insufficient documentation

## 2016-01-12 DIAGNOSIS — R102 Pelvic and perineal pain: Secondary | ICD-10-CM

## 2016-01-12 DIAGNOSIS — N83202 Unspecified ovarian cyst, left side: Secondary | ICD-10-CM

## 2016-01-12 DIAGNOSIS — K5900 Constipation, unspecified: Secondary | ICD-10-CM

## 2016-01-12 HISTORY — DX: Hypoglycemia, unspecified: E16.2

## 2016-01-12 HISTORY — DX: Irritable bowel syndrome, unspecified: K58.9

## 2016-01-12 HISTORY — DX: Anemia, unspecified: D64.9

## 2016-01-12 LAB — CBC
HEMATOCRIT: 39.6 % (ref 35.0–47.0)
HEMOGLOBIN: 13.3 g/dL (ref 12.0–16.0)
MCH: 27.3 pg (ref 26.0–34.0)
MCHC: 33.5 g/dL (ref 32.0–36.0)
MCV: 81.7 fL (ref 80.0–100.0)
Platelets: 214 10*3/uL (ref 150–440)
RBC: 4.85 MIL/uL (ref 3.80–5.20)
RDW: 13.3 % (ref 11.5–14.5)
WBC: 6.8 10*3/uL (ref 3.6–11.0)

## 2016-01-12 LAB — URINALYSIS COMPLETE WITH MICROSCOPIC (ARMC ONLY)
Bilirubin Urine: NEGATIVE
GLUCOSE, UA: NEGATIVE mg/dL
Hgb urine dipstick: NEGATIVE
Ketones, ur: NEGATIVE mg/dL
Nitrite: NEGATIVE
PH: 8 (ref 5.0–8.0)
PROTEIN: NEGATIVE mg/dL
SPECIFIC GRAVITY, URINE: 1.011 (ref 1.005–1.030)

## 2016-01-12 LAB — COMPREHENSIVE METABOLIC PANEL
ALBUMIN: 4.5 g/dL (ref 3.5–5.0)
ALK PHOS: 52 U/L (ref 38–126)
ALT: 20 U/L (ref 14–54)
ANION GAP: 6 (ref 5–15)
AST: 19 U/L (ref 15–41)
BUN: 10 mg/dL (ref 6–20)
CALCIUM: 9.1 mg/dL (ref 8.9–10.3)
CHLORIDE: 105 mmol/L (ref 101–111)
CO2: 25 mmol/L (ref 22–32)
Creatinine, Ser: 0.7 mg/dL (ref 0.44–1.00)
GFR calc Af Amer: >60 mL/min (ref >60)
GFR calc non Af Amer: >60 mL/min (ref >60)
GLUCOSE: 101 mg/dL — AB (ref 65–99)
POTASSIUM: 4.2 mmol/L (ref 3.5–5.1)
SODIUM: 136 mmol/L (ref 135–145)
Total Bilirubin: 0.6 mg/dL (ref 0.3–1.2)
Total Protein: 7.3 g/dL (ref 6.5–8.1)

## 2016-01-12 LAB — LIPASE, BLOOD: LIPASE: 19 U/L (ref 11–51)

## 2016-01-12 LAB — POC URINE PREG, ED: Preg Test, Ur: NEGATIVE

## 2016-01-12 MED ORDER — POLYETHYLENE GLYCOL 3350 17 G PO PACK: 17.0000 g | PACK | Freq: Every day | ORAL | Status: DC

## 2016-01-12 MED ORDER — OXYCODONE-ACETAMINOPHEN 5-325 MG PO TABS
2.0000 | ORAL_TABLET | Freq: Once | ORAL | Status: AC
  Administered 2016-01-12: 2 via ORAL
  Filled 2016-01-12: qty 2

## 2016-01-12 MED ORDER — OXYCODONE-ACETAMINOPHEN 5-325 MG PO TABS: 2.0000 | ORAL_TABLET | Freq: Four times a day (QID) | ORAL | Status: DC | PRN

## 2016-01-12 MED ORDER — ONDANSETRON 4 MG PO TBDP
4.0000 mg | ORAL_TABLET | Freq: Once | ORAL | Status: AC | PRN
Start: 2016-01-12 — End: 2016-01-12
  Administered 2016-01-12: 4 mg via ORAL
  Filled 2016-01-12: qty 1

## 2016-01-12 NOTE — ED Notes (Signed)
Patient transported to Ultrasound 

## 2016-01-12 NOTE — ED Notes (Signed)
Patient presents to the ED with left upper quadrant abdominal pain and flank pain that began yesterday evening.  Patient reports pain has been increasing in severity and now patient reports nausea.  Patient denies vomiting and diarrhea.  Patient appears slightly uncomfortable in triage.  Patient states lying down is more comfortable that sitting or standing.  Patient denies any history of similar pain.

## 2016-01-12 NOTE — Discharge Instructions (Signed)
Abdominal Pain, Adult °Many things can cause abdominal pain. Usually, abdominal pain is not caused by a disease and will improve without treatment. It can often be observed and treated at home. Your health care provider will do a physical exam and possibly order blood tests and X-rays to help determine the seriousness of your pain. However, in many cases, more time must pass before a clear cause of the pain can be found. Before that point, your health care provider may not know if you need more testing or further treatment. °HOME CARE INSTRUCTIONS °Monitor your abdominal pain for any changes. The following actions may help to alleviate any discomfort you are experiencing: °· Only take over-the-counter or prescription medicines as directed by your health care provider. °· Do not take laxatives unless directed to do so by your health care provider. °· Try a clear liquid diet (broth, tea, or water) as directed by your health care provider. Slowly move to a bland diet as tolerated. °SEEK MEDICAL CARE IF: °· You have unexplained abdominal pain. °· You have abdominal pain associated with nausea or diarrhea. °· You have pain when you urinate or have a bowel movement. °· You experience abdominal pain that wakes you in the night. °· You have abdominal pain that is worsened or improved by eating food. °· You have abdominal pain that is worsened with eating fatty foods. °· You have a fever. °SEEK IMMEDIATE MEDICAL CARE IF: °· Your pain does not go away within 2 hours. °· You keep throwing up (vomiting). °· Your pain is felt only in portions of the abdomen, such as the right side or the left lower portion of the abdomen. °· You pass bloody or black tarry stools. °MAKE SURE YOU: °· Understand these instructions. °· Will watch your condition. °· Will get help right away if you are not doing well or get worse. °  °This information is not intended to replace advice given to you by your health care provider. Make sure you discuss  any questions you have with your health care provider. °  °Document Released: 06/06/2005 Document Revised: 05/18/2015 Document Reviewed: 05/06/2013 °Elsevier Interactive Patient Education ©2016 Elsevier Inc. ° °Constipation, Adult °Constipation is when a person has fewer than three bowel movements a week, has difficulty having a bowel movement, or has stools that are dry, hard, or larger than normal. As people grow older, constipation is more common. A low-fiber diet, not taking in enough fluids, and taking certain medicines may make constipation worse.  °CAUSES  °· Certain medicines, such as antidepressants, pain medicine, iron supplements, antacids, and water pills.   °· Certain diseases, such as diabetes, irritable bowel syndrome (IBS), thyroid disease, or depression.   °· Not drinking enough water.   °· Not eating enough fiber-rich foods.   °· Stress or travel.   °· Lack of physical activity or exercise.   °· Ignoring the urge to have a bowel movement.   °· Using laxatives too much.   °SIGNS AND SYMPTOMS  °· Having fewer than three bowel movements a week.   °· Straining to have a bowel movement.   °· Having stools that are hard, dry, or larger than normal.   °· Feeling full or bloated.   °· Pain in the lower abdomen.   °· Not feeling relief after having a bowel movement.   °DIAGNOSIS  °Your health care provider will take a medical history and perform a physical exam. Further testing may be done for severe constipation. Some tests may include: °· A barium enema X-ray to examine your rectum, colon, and, sometimes,   your small intestine.   A sigmoidoscopy to examine your lower colon.   A colonoscopy to examine your entire colon. TREATMENT  Treatment will depend on the severity of your constipation and what is causing it. Some dietary treatments include drinking more fluids and eating more fiber-rich foods. Lifestyle treatments may include regular exercise. If these diet and lifestyle recommendations do not  help, your health care provider may recommend taking over-the-counter laxative medicines to help you have bowel movements. Prescription medicines may be prescribed if over-the-counter medicines do not work.  HOME CARE INSTRUCTIONS   Eat foods that have a lot of fiber, such as fruits, vegetables, whole grains, and beans.  Limit foods high in fat and processed sugars, such as french fries, hamburgers, cookies, candies, and soda.   A fiber supplement may be added to your diet if you cannot get enough fiber from foods.   Drink enough fluids to keep your urine clear or pale yellow.   Exercise regularly or as directed by your health care provider.   Go to the restroom when you have the urge to go. Do not hold it.   Only take over-the-counter or prescription medicines as directed by your health care provider. Do not take other medicines for constipation without talking to your health care provider first.  SEEK IMMEDIATE MEDICAL CARE IF:   You have bright red blood in your stool.   Your constipation lasts for more than 4 days or gets worse.   You have abdominal or rectal pain.   You have thin, pencil-like stools.   You have unexplained weight loss. MAKE SURE YOU:   Understand these instructions.  Will watch your condition.  Will get help right away if you are not doing well or get worse.   This information is not intended to replace advice given to you by your health care provider. Make sure you discuss any questions you have with your health care provider.   Document Released: 05/25/2004 Document Revised: 09/17/2014 Document Reviewed: 06/08/2013 Elsevier Interactive Patient Education 2016 Elsevier Inc. Ovarian Cyst An ovarian cyst is a fluid-filled sac that forms on an ovary. The ovaries are small organs that produce eggs in women. Various types of cysts can form on the ovaries. Most are not cancerous. Many do not cause problems, and they often go away on their own. Some  may cause symptoms and require treatment. Common types of ovarian cysts include:  Functional cysts--These cysts may occur every month during the menstrual cycle. This is normal. The cysts usually go away with the next menstrual cycle if the woman does not get pregnant. Usually, there are no symptoms with a functional cyst.  Endometrioma cysts--These cysts form from the tissue that lines the uterus. They are also called "chocolate cysts" because they become filled with blood that turns brown. This type of cyst can cause pain in the lower abdomen during intercourse and with your menstrual period.  Cystadenoma cysts--This type develops from the cells on the outside of the ovary. These cysts can get very big and cause lower abdomen pain and pain with intercourse. This type of cyst can twist on itself, cut off its blood supply, and cause severe pain. It can also easily rupture and cause a lot of pain.  Dermoid cysts--This type of cyst is sometimes found in both ovaries. These cysts may contain different kinds of body tissue, such as skin, teeth, hair, or cartilage. They usually do not cause symptoms unless they get very big.  Theca lutein  cysts--These cysts occur when too much of a certain hormone (human chorionic gonadotropin) is produced and overstimulates the ovaries to produce an egg. This is most common after procedures used to assist with the conception of a baby (in vitro fertilization). CAUSES   Fertility drugs can cause a condition in which multiple large cysts are formed on the ovaries. This is called ovarian hyperstimulation syndrome.  A condition called polycystic ovary syndrome can cause hormonal imbalances that can lead to nonfunctional ovarian cysts. SIGNS AND SYMPTOMS  Many ovarian cysts do not cause symptoms. If symptoms are present, they may include:  Pelvic pain or pressure.  Pain in the lower abdomen.  Pain during sexual intercourse.  Increasing girth (swelling) of the  abdomen.  Abnormal menstrual periods.  Increasing pain with menstrual periods.  Stopping having menstrual periods without being pregnant. DIAGNOSIS  These cysts are commonly found during a routine or annual pelvic exam. Tests may be ordered to find out more about the cyst. These tests may include:  Ultrasound.  X-ray of the pelvis.  CT scan.  MRI.  Blood tests. TREATMENT  Many ovarian cysts go away on their own without treatment. Your health care provider may want to check your cyst regularly for 2-3 months to see if it changes. For women in menopause, it is particularly important to monitor a cyst closely because of the higher rate of ovarian cancer in menopausal women. When treatment is needed, it may include any of the following:  A procedure to drain the cyst (aspiration). This may be done using a long needle and ultrasound. It can also be done through a laparoscopic procedure. This involves using a thin, lighted tube with a tiny camera on the end (laparoscope) inserted through a small incision.  Surgery to remove the whole cyst. This may be done using laparoscopic surgery or an open surgery involving a larger incision in the lower abdomen.  Hormone treatment or birth control pills. These methods are sometimes used to help dissolve a cyst. HOME CARE INSTRUCTIONS   Only take over-the-counter or prescription medicines as directed by your health care provider.  Follow up with your health care provider as directed.  Get regular pelvic exams and Pap tests. SEEK MEDICAL CARE IF:   Your periods are late, irregular, or painful, or they stop.  Your pelvic pain or abdominal pain does not go away.  Your abdomen becomes larger or swollen.  You have pressure on your bladder or trouble emptying your bladder completely.  You have pain during sexual intercourse.  You have feelings of fullness, pressure, or discomfort in your stomach.  You lose weight for no apparent reason.  You  feel generally ill.  You become constipated.  You lose your appetite.  You develop acne.  You have an increase in body and facial hair.  You are gaining weight, without changing your exercise and eating habits.  You think you are pregnant. SEEK IMMEDIATE MEDICAL CARE IF:   You have increasing abdominal pain.  You feel sick to your stomach (nauseous), and you throw up (vomit).  You develop a fever that comes on suddenly.  You have abdominal pain during a bowel movement.  Your menstrual periods become heavier than usual. MAKE SURE YOU:  Understand these instructions.  Will watch your condition.  Will get help right away if you are not doing well or get worse.   This information is not intended to replace advice given to you by your health care provider. Make sure you  discuss any questions you have with your health care provider.   Document Released: 08/27/2005 Document Revised: 09/01/2013 Document Reviewed: 05/04/2013 Elsevier Interactive Patient Education [employer]

## 2016-01-12 NOTE — ED Provider Notes (Addendum)
[facility] Emergency Department Provider Note        Time seen: ----------------------------------------- 11:22 AM on 01/12/2016 -----------------------------------------    I have reviewed the triage vital signs and the nursing notes.   HISTORY  Chief Complaint Abdominal Pain    HPI Alexis [name] is a 30 y.o. female who presents to ER for left upper quadrant and left flank pain that began yesterday evening. Patient describes a sharp and stabbing, nothing makes it better or worse. She has had some nausea but denies vomiting or diarrhea. Patient denies a history of same.   Past Medical History  Diagnosis Date  . Anemia   . Hypoglycemia   . Irritable bowel syndrome     There are no active problems to display for this patient.   Past Surgical History  Procedure Laterality Date  . Tubal ligation    . Cholecystectomy      Allergies Morphine and related  Social History Social History  Substance Use Topics  . Smoking status: Never Smoker   . Smokeless tobacco: None  . Alcohol Use: Yes     Comment: rarely    Review of Systems Constitutional: Negative for fever. Eyes: Negative for visual changes. ENT: Negative for sore throat. Cardiovascular: Negative for chest pain. Respiratory: Negative for shortness of breath. Gastrointestinal: Positive for left flank pain Genitourinary: Negative for dysuria. Musculoskeletal: Negative for back pain. Skin: Negative for rash. Neurological: Negative for headaches, focal weakness or numbness.  10-point ROS otherwise negative.  ____________________________________________   PHYSICAL EXAM:  VITAL SIGNS: ED Triage Vitals  Enc Vitals Group     BP 01/12/16 1013 117/62 mmHg     Pulse Rate 01/12/16 1013 73     Resp 01/12/16 1013 18     Temp 01/12/16 1013 99 F (37.2 C)     Temp Source 01/12/16 1013 Oral     SpO2 01/12/16 1013 97 %     Weight 01/12/16 1013 195 lb (88.451 kg)     Height  01/12/16 1013  (1.753 m)     Head Cir --      Peak Flow --      Pain Score 01/12/16 1014 5     Pain Loc --      Pain Edu? --      Excl. in GC? --     Constitutional: Alert and oriented. Mild distress Eyes: Conjunctivae are normal. PERRL. Normal extraocular movements. ENT   Head: Normocephalic and atraumatic.   Nose: No congestion/rhinnorhea.   Mouth/Throat: Mucous membranes are moist.   Neck: No stridor. Cardiovascular: Normal rate, regular rhythm. No murmurs, rubs, or gallops. Respiratory: Normal respiratory effort without tachypnea nor retractions. Breath sounds are clear and equal bilaterally. No wheezes/rales/rhonchi. Gastrointestinal: Left flank tenderness, no rebound or guarding. Normal bowel sounds. Musculoskeletal: Nontender with normal range of motion in all extremities. No lower extremity tenderness nor edema. Neurologic:  Normal speech and language. No gross focal neurologic deficits are appreciated.  Skin:  Skin is warm, dry and intact. No rash noted. Psychiatric: Mood and affect are normal. Speech and behavior are normal.   ____________________________________________  ED COURSE:  Pertinent labs & imaging results that were available during my care of the patient were reviewed by me and considered in my medical decision making (see chart for details). Patient presents with severe left flank pain, we'll give pain medicine and assess with CT imaging and basic labs. ____________________________________________    LABS (pertinent positives/negatives)  Labs Reviewed  COMPREHENSIVE  METABOLIC PANEL - Abnormal; Notable for the following:    Glucose, Bld 101 (*)    All other components within normal limits  URINALYSIS COMPLETEWITH MICROSCOPIC ([facility] ONLY) - Abnormal; Notable for the following:    Color, Urine YELLOW (*)    APPearance CLEAR (*)    Leukocytes, UA TRACE (*)    Bacteria, UA RARE (*)    Squamous Epithelial / LPF 0-5 (*)    All other  components within normal limits  WET PREP, GENITAL  CHLAMYDIA/NGC RT PCR ([facility] ONLY)  LIPASE, BLOOD  CBC  POC URINE PREG, ED    RADIOLOGY Images were viewed by me  CT renal protocol  IMPRESSION: No acute abnormality noted. IMPRESSION: Probable hemorrhagic left ovarian cyst. Small amount of free fluid in cul-de-sac. Question recent ovarian cyst rupture. Short-interval follow up ultrasound in 6-12 weeks is recommended, preferably during the week following the patient's normal menses.  Study otherwise unremarkable. No ovarian torsion on either side. No intrauterine mass. ____________________________________________  FINAL ASSESSMENT AND PLAN  Flank pain, ovarian cyst  Plan: Patient with labs and imaging as dictated above. No clear etiology for her flank pain other than constipation and/or ovarian cyst with recent rupture.. Patient being discharged with MiraLAX, pain medication and is encouraged to have close follow-up with her doctor if not improving.    Emily FilbertWilliams, Jonathan E, MD   Note: This dictation was prepared with Dragon dictation. Any transcriptional errors that result from this process are unintentional   Emily FilbertJonathan E Williams, MD 01/12/16 1227  Emily FilbertJonathan E Williams, MD 01/12/16 [phone]

## 2016-07-05 ENCOUNTER — Emergency Department
Admission: EM | Admit: 2016-07-05 | Discharge: 2016-07-05 | Disposition: A | Discharge status: Home / Self Care | Payer: Self-pay | Attending: Emergency Medicine | Admitting: Emergency Medicine

## 2016-07-05 ENCOUNTER — Encounter: Payer: Self-pay | Admitting: Emergency Medicine

## 2016-07-05 ENCOUNTER — Emergency Department: Payer: Self-pay

## 2016-07-05 DIAGNOSIS — Y9389 Activity, other specified: Secondary | ICD-10-CM | POA: Insufficient documentation

## 2016-07-05 DIAGNOSIS — Y999 Unspecified external cause status: Secondary | ICD-10-CM | POA: Insufficient documentation

## 2016-07-05 DIAGNOSIS — Y929 Unspecified place or not applicable: Secondary | ICD-10-CM | POA: Insufficient documentation

## 2016-07-05 DIAGNOSIS — S60221A Contusion of right hand, initial encounter: Secondary | ICD-10-CM | POA: Insufficient documentation
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
## 2017-07-05 ENCOUNTER — Emergency Department
Admission: EM | Admit: 2017-07-05 | Discharge: 2017-07-05 | Disposition: A | Discharge status: Home / Self Care | Payer: BLUE CROSS/BLUE SHIELD | Attending: Emergency Medicine | Admitting: Emergency Medicine

## 2017-07-05 ENCOUNTER — Emergency Department
Admission: EM | Admit: 2017-07-05 | Discharge: 2017-07-06 | Disposition: A | Discharge status: Home / Self Care | Payer: BLUE CROSS/BLUE SHIELD | Source: Home / Self Care | Attending: Emergency Medicine | Admitting: Emergency Medicine

## 2017-07-05 ENCOUNTER — Emergency Department: Payer: BLUE CROSS/BLUE SHIELD

## 2017-07-05 ENCOUNTER — Encounter: Payer: Self-pay | Admitting: Emergency Medicine

## 2017-07-05 DIAGNOSIS — N889 Noninflammatory disorder of cervix uteri, unspecified: Secondary | ICD-10-CM

## 2017-07-05 DIAGNOSIS — R102 Pelvic and perineal pain: Secondary | ICD-10-CM

## 2017-07-05 DIAGNOSIS — N83201 Unspecified ovarian cyst, right side: Secondary | ICD-10-CM | POA: Insufficient documentation

## 2017-07-05 DIAGNOSIS — Z79899 Other long term (current) drug therapy: Secondary | ICD-10-CM

## 2017-07-05 LAB — URINALYSIS, COMPLETE (UACMP) WITH MICROSCOPIC
BILIRUBIN URINE: NEGATIVE
Bacteria, UA: NONE SEEN
Bacteria, UA: NONE SEEN
Bilirubin Urine: NEGATIVE
GLUCOSE, UA: NEGATIVE mg/dL
GLUCOSE, UA: NEGATIVE mg/dL
HGB URINE DIPSTICK: NEGATIVE
Hgb urine dipstick: NEGATIVE
KETONES UR: NEGATIVE mg/dL
Ketones, ur: NEGATIVE mg/dL
LEUKOCYTES UA: NEGATIVE
NITRITE: NEGATIVE
Nitrite: NEGATIVE
PH: 6 (ref 5.0–8.0)
PH: 5 (ref 5.0–8.0)
PROTEIN: NEGATIVE mg/dL
Protein, ur: NEGATIVE mg/dL
Specific Gravity, Urine: 1.019 (ref 1.005–1.030)
Specific Gravity, Urine: 1.042 — ABNORMAL HIGH (ref 1.005–1.030)

## 2017-07-05 LAB — CBC WITH DIFFERENTIAL/PLATELET
BASOS ABS: 0.1 10*3/uL (ref 0–0.1)
BASOS PCT: 0 %
BASOS PCT: 1 %
Basophils Absolute: 0 10*3/uL (ref 0–0.1)
EOS ABS: 0.3 10*3/uL (ref 0–0.7)
EOS PCT: 4 %
EOS PCT: 3 %
Eosinophils Absolute: 0.2 10*3/uL (ref 0–0.7)
HCT: 38.3 % (ref 35.0–47.0)
HEMATOCRIT: 39.2 % (ref 35.0–47.0)
HEMOGLOBIN: 13 g/dL (ref 12.0–16.0)
Hemoglobin: 13.2 g/dL (ref 12.0–16.0)
LYMPHS ABS: 1.6 10*3/uL (ref 1.0–3.6)
LYMPHS ABS: 2.4 10*3/uL (ref 1.0–3.6)
Lymphocytes Relative: 19 %
Lymphocytes Relative: 25 %
MCH: 28.3 pg (ref 26.0–34.0)
MCH: 28.4 pg (ref 26.0–34.0)
MCHC: 33.8 g/dL (ref 32.0–36.0)
MCHC: 33.9 g/dL (ref 32.0–36.0)
MCV: 83.9 fL (ref 80.0–100.0)
MCV: 83.9 fL (ref 80.0–100.0)
MONO ABS: 0.4 10*3/uL (ref 0.2–0.9)
Monocytes Absolute: 0.4 10*3/uL (ref 0.2–0.9)
Monocytes Relative: 5 %
Monocytes Relative: 4 %
NEUTROS PCT: 67 %
Neutro Abs: 6 10*3/uL (ref 1.4–6.5)
Neutro Abs: 6.6 10*3/uL — ABNORMAL HIGH (ref 1.4–6.5)
Neutrophils Relative %: 72 %
PLATELETS: 232 10*3/uL (ref 150–440)
Platelets: 234 10*3/uL (ref 150–440)
RBC: 4.67 MIL/uL (ref 3.80–5.20)
RBC: 4.57 MIL/uL (ref 3.80–5.20)
RDW: 13.6 % (ref 11.5–14.5)
RDW: 13.9 % (ref 11.5–14.5)
WBC: 8.3 10*3/uL (ref 3.6–11.0)
WBC: 9.7 10*3/uL (ref 3.6–11.0)

## 2017-07-05 LAB — COMPREHENSIVE METABOLIC PANEL
ALBUMIN: 4.1 g/dL (ref 3.5–5.0)
ALBUMIN: 4.3 g/dL (ref 3.5–5.0)
ALK PHOS: 53 U/L (ref 38–126)
ALT: 17 U/L (ref 14–54)
ALT: 16 U/L (ref 14–54)
ANION GAP: 9 (ref 5–15)
ANION GAP: 8 (ref 5–15)
AST: 19 U/L (ref 15–41)
AST: 16 U/L (ref 15–41)
Alkaline Phosphatase: 48 U/L (ref 38–126)
BILIRUBIN TOTAL: 0.5 mg/dL (ref 0.3–1.2)
BUN: 12 mg/dL (ref 6–20)
BUN: 14 mg/dL (ref 6–20)
CALCIUM: 9.2 mg/dL (ref 8.9–10.3)
CHLORIDE: 106 mmol/L (ref 101–111)
CO2: 24 mmol/L (ref 22–32)
CO2: 26 mmol/L (ref 22–32)
CREATININE: 0.88 mg/dL (ref 0.44–1.00)
Calcium: 9.1 mg/dL (ref 8.9–10.3)
Chloride: 104 mmol/L (ref 101–111)
Creatinine, Ser: 0.85 mg/dL (ref 0.44–1.00)
GFR calc Af Amer: >60 mL/min (ref >60)
GFR calc Af Amer: >60 mL/min (ref >60)
GFR calc non Af Amer: >60 mL/min (ref >60)
GFR calc non Af Amer: >60 mL/min (ref >60)
GLUCOSE: 102 mg/dL — AB (ref 65–99)
GLUCOSE: 112 mg/dL — AB (ref 65–99)
Potassium: 3.8 mmol/L (ref 3.5–5.1)
Potassium: 3.5 mmol/L (ref 3.5–5.1)
SODIUM: 139 mmol/L (ref 135–145)
SODIUM: 138 mmol/L (ref 135–145)
TOTAL PROTEIN: 7.3 g/dL (ref 6.5–8.1)
Total Bilirubin: 0.3 mg/dL (ref 0.3–1.2)
Total Protein: 7.4 g/dL (ref 6.5–8.1)

## 2017-07-05 LAB — WET PREP, GENITAL
Sperm: NONE SEEN
Trich, Wet Prep: NONE SEEN
Yeast Wet Prep HPF POC: NONE SEEN

## 2017-07-05 LAB — CHLAMYDIA/NGC RT PCR (ARMC ONLY)
CHLAMYDIA TR: NOT DETECTED
N GONORRHOEAE: NOT DETECTED

## 2017-07-05 LAB — POCT PREGNANCY, URINE: Preg Test, Ur: NEGATIVE

## 2017-07-05 LAB — HCG, QUANTITATIVE, PREGNANCY

## 2017-07-05 LAB — LIPASE, BLOOD: LIPASE: 37 U/L (ref 11–51)

## 2017-07-05 MED ORDER — OXYCODONE-ACETAMINOPHEN 5-325 MG PO TABS: 1.0000 | ORAL_TABLET | Freq: Four times a day (QID) | ORAL | 0 refills | Status: DC | PRN

## 2017-07-05 MED ORDER — IOPAMIDOL (ISOVUE-300) INJECTION 61%
100.0000 mL | Freq: Once | INTRAVENOUS | Status: AC | PRN
  Administered 2017-07-05: 100 mL via INTRAVENOUS

## 2017-07-05 MED ORDER — KETOROLAC TROMETHAMINE 30 MG/ML IJ SOLN
15.0000 mg | Freq: Once | INTRAMUSCULAR | Status: AC
  Administered 2017-07-05: 15 mg via INTRAVENOUS
  Filled 2017-07-05: qty 1

## 2017-07-05 MED ORDER — HYDROCODONE-ACETAMINOPHEN 5-325 MG PO TABS
1.0000 | ORAL_TABLET | Freq: Once | ORAL | Status: AC
  Administered 2017-07-05: 1 via ORAL
  Filled 2017-07-05: qty 1

## 2017-07-05 MED ORDER — ONDANSETRON HCL 4 MG/2ML IJ SOLN
4.0000 mg | Freq: Once | INTRAMUSCULAR | Status: AC
  Administered 2017-07-05: 4 mg via INTRAVENOUS
  Filled 2017-07-05: qty 2

## 2017-07-05 NOTE — ED Triage Notes (Addendum)
Pt c/o lower abdominal/suprapubic pain with nausea.  Pain inside vaginal as well without discharge. Pain started today. No vomiting. No known fevers.  Hx ovarian cyst but pain is mid.

## 2017-07-05 NOTE — ED Notes (Signed)
Returned from [facility]/S

## 2017-07-05 NOTE — Discharge Instructions (Signed)
As I explained to you, your pelvic ultrasound and CT scan showed a very large ovarian cyst. That predisposes you for ovarian torsion. If you develop severe new pain on the right lower part of your abdomen you must return to the emergency room immediately for evaluation as this is a surgical emergency and it may lead to you losing your ovary if care is delayed. Otherwise I would like for you to follow-up with OB/GYN for evaluation of this large cyst and also for evaluation of the lesion seen on the cervix of your uterus on pelvic exam.

## 2017-07-05 NOTE — ED Triage Notes (Signed)
Patient seen in this ED this AM and was informed that has a large cyst on right ovary, and that ovary is twisted. Patient was ordered to return back to ED by provider if pain becomes severe.

## 2017-07-05 NOTE — ED Triage Notes (Signed)
FIRST NURSE NOTE-seen today. Reports abdominal pain worse. Ambulatory. No distress.

## 2017-07-05 NOTE — Discharge Instructions (Signed)
please return for worse pain fever or vomiting. Please follow-up with OB/GYN as planned. For severe pain I will give you as some Percocets one pill 4 times a day.

## 2017-07-05 NOTE — ED Provider Notes (Addendum)
[facility] Emergency Department Provider Note   ____________________________________________   First MD Initiated Contact with Patient 07/05/17 2051     (approximate)  I have reviewed the triage vital signs and the nursing notes.   HISTORY  Chief Complaint Pelvic Pain   HPI Alexis [name] is a 31 y.o. female Was seen earlier today for right-sided pelvic pain. She'll R cyst there and was told to come back if pain got worse because the possibility of torsion. Patient reports she left from here and went to work and the pain did get worse so she came back here again.the pain is now in the right lower to mid abdomen still constant and pressure-like. It's much worse than it had been. Seems to be made somewhat worse with movement.   Past Medical History:  Diagnosis Date  . Anemia   . Hypoglycemia   . Irritable bowel syndrome     There are no active problems to display for this patient.   Past Surgical History:  Procedure Laterality Date  . CHOLECYSTECTOMY    . TUBAL LIGATION      Prior to Admission medications   Medication Sig Start Date End Date Taking? Authorizing Provider  FLUoxetine (PROZAC) 10 MG tablet Take 10 mg by mouth daily.    [provider]  oxyCODONE-acetaminophen (PERCOCET) 5-325 MG tablet Take 2 tablets by mouth every 6 (six) hours as needed for moderate pain or severe pain. Patient not taking: Reported on 07/05/2017 01/12/16   Emily FilbertWilliams, Jonathan E, MD  polyethylene glycol [facility](MIRALAX / Ethelene HalGLYCOLAX) packet Take 17 g by mouth daily. Patient not taking: Reported on 07/05/2017 01/12/16   Emily FilbertWilliams, Jonathan E, MD    Allergies Morphine and related  No family history on file.  Social History Social History  Substance Use Topics  . Smoking status: Never Smoker  . Smokeless tobacco: Never Used  . Alcohol use Yes     Comment: rarely    Review of Systems  Constitutional: No fever/chills Eyes: No visual changes. ENT: No  sore throat. Cardiovascular: Denies chest pain. Respiratory: Denies shortness of breath. Gastrointestinal:see history of present illness. Genitourinary: Negative for dysuria. Musculoskeletal: Negative for back pain. Skin: Negative for rash. Neurological: Negative for headaches, focal weakness or numbness.   ____________________________________________   PHYSICAL EXAM:  VITAL SIGNS: ED Triage Vitals  Enc Vitals Group     BP 07/05/17 1915 (!) 122/56     Pulse --      Resp 07/05/17 1915 18     Temp 07/05/17 1915 97.7 F (36.5 C)     Temp Source 07/05/17 1915 Oral     SpO2 07/05/17 1915 97 %     Weight 07/05/17 1934 198 lb (89.8 kg)     Height --      Head Circumference --      Peak Flow --      Pain Score 07/05/17 1934 8     Pain Loc --      Pain Edu? --      Excl. in GC? --     Constitutional: Alert and oriented. Well appearing and in no acute distress! Eyes: Conjunctivae are normal.  Head: Atraumatic. Nose: No congestion/rhinnorhea. Mouth/Throat: Mucous membranes are moist.  Oropharynx non-erythematous. Neck: No stridor. Cardiovascular: Normal rate, regular rhythm. Grossly normal heart sounds.  Good peripheral circulation. Respiratory: Normal respiratory effort.  No retractions. Lungs CTAB. Gastrointestinal: Soft atender to palpation in the mid abdomen on the right side. Patient is not jumping  off the bed as was described before. No distention. No abdominal bruits. slight right CVA tenderness. Musculoskeletal: No lower extremity tenderness nor edema.  No joint effusions. Neurologic:  Normal speech and language. No gross focal neurologic deficits are appreciated. No gait instability. Skin:  Skin is warm, dry and intact. No rash noted.  ____________________________________________   LABS (all labs ordered are listed, but only abnormal results are displayed)  Labs Reviewed  COMPREHENSIVE METABOLIC PANEL  LIPASE, BLOOD  CBC WITH DIFFERENTIAL/PLATELET  URINALYSIS,  COMPLETE (UACMP) WITH MICROSCOPIC   ____________________________________________  EKG   ____________________________________________  RADIOLOGY  US Pelvis Transvanginal Non-ob (tv Only)  Result Date: 07/05/2017 CLINICAL DATA:  Pelvic pain EXAM: TRANSABDOMINAL AND TRANSVAGINAL ULTRASOUND OF PELVIS DOPPLER ULTRASOUND OF OVARIES TECHNIQUE: Study was performed transabdominally to optimize pelvic field of view evaluation and transvaginally to optimize internal visceral architecture evaluation. Color and duplex Doppler ultrasound was utilized to evaluate blood flow to the ovaries. COMPARISON:  Pelvic ultrasound Jan 12, 2016; CT abdomen and pelvis Jan 12, 2016 FINDINGS: Uterus Measurements: 9.4 x 4.6 x 5.1 cm. No fibroids or other mass visualized. Endometrium Thickness: 12 mm.  No focal abnormality visualized. Right ovary Measurements: 6.7 x 5.2 x 6.3 cm. There is a a cyst arising from the left ovary measuring 6.4 x 4.8 x 5.8 cm. No other right-sided pelvic mass evident. Left ovary Measurements: 2.8 x 1.7 x 1.7 cm. Normal appearance/no adnexal mass. Pulsed Doppler evaluation of both ovaries demonstrates normal low-resistance arterial and venous waveforms. Other findings There is a small amount of free pelvic fluid.  Free fluid. IMPRESSION: 1. There is a cyst arising from the right ovary measuring 6.4 x 4.8 x 5.8 cm. Right ovary otherwise appears normal. 2.  Left ovary appears normal. 3. Small amount of free pelvic fluid may represent recent ovarian leakage or rupture. 4. No ovarian torsion evident. Low resistance waveforms noted in each ovary. 5.  Uterus and endometrium appear normal. The right ovarian cyst is almost certainly benign, but follow up ultrasound is recommended in 1 year according to the Society of Radiologists in Ultrasound2010 Consensus Conference Statement (D Lenis Noon et al. Management of Asymptomatic Ovarian and Other Adnexal Cysts Imaged at [place]: Society of Radiologists in Ultrasound Consensus  Conference Statement 2010. Radiology 256 (Sept 2010): 943-954.). Electronically Signed   By: Bretta Bang III M.D.   On: 07/05/2017 10:39   US Pelvis (transabdominal Only)  Result Date: 07/05/2017 CLINICAL DATA:  Pelvic pain EXAM: TRANSABDOMINAL AND TRANSVAGINAL ULTRASOUND OF PELVIS DOPPLER ULTRASOUND OF OVARIES TECHNIQUE: Study was performed transabdominally to optimize pelvic field of view evaluation and transvaginally to optimize internal visceral architecture evaluation. Color and duplex Doppler ultrasound was utilized to evaluate blood flow to the ovaries. COMPARISON:  Pelvic ultrasound Jan 12, 2016; CT abdomen and pelvis Jan 12, 2016 FINDINGS: Uterus Measurements: 9.4 x 4.6 x 5.1 cm. No fibroids or other mass visualized. Endometrium Thickness: 12 mm.  No focal abnormality visualized. Right ovary Measurements: 6.7 x 5.2 x 6.3 cm. There is a a cyst arising from the left ovary measuring 6.4 x 4.8 x 5.8 cm. No other right-sided pelvic mass evident. Left ovary Measurements: 2.8 x 1.7 x 1.7 cm. Normal appearance/no adnexal mass. Pulsed Doppler evaluation of both ovaries demonstrates normal low-resistance arterial and venous waveforms. Other findings There is a small amount of free pelvic fluid.  Free fluid. IMPRESSION: 1. There is a cyst arising from the right ovary measuring 6.4 x 4.8 x 5.8 cm. Right ovary otherwise appears normal.  2.  Left ovary appears normal. 3. Small amount of free pelvic fluid may represent recent ovarian leakage or rupture. 4. No ovarian torsion evident. Low resistance waveforms noted in each ovary. 5.  Uterus and endometrium appear normal. The right ovarian cyst is almost certainly benign, but follow up ultrasound is recommended in 1 year according to the Society of Radiologists in Ultrasound2010 Consensus Conference Statement (D Lenis Noon et al. Management of Asymptomatic Ovarian and Other Adnexal Cysts Imaged at [place]: Society of Radiologists in Ultrasound Consensus Conference Statement  2010. Radiology 256 (Sept 2010): 943-954.). Electronically Signed   By: Bretta Bang III M.D.   On: 07/05/2017 10:39   Ct Abdomen Pelvis W Contrast  Result Date: 07/05/2017 CLINICAL DATA:  Patient with lower abdominal and suprapubic pain. Nausea. EXAM: CT ABDOMEN AND PELVIS WITH CONTRAST TECHNIQUE: Multidetector CT imaging of the abdomen and pelvis was performed using the standard protocol following bolus administration of intravenous contrast. CONTRAST:  ISOVUE-300 IOPAMIDOL (ISOVUE-300) INJECTION 61% COMPARISON:  CT abdomen pelvis 01/12/2016. FINDINGS: Lower chest: Normal heart size. Dependent atelectasis within the bilateral lower lobes. Hepatobiliary: Liver is normal in size and contour. No focal hepatic lesions identified. Gallbladder is surgically absent. Pancreas: Unremarkable Spleen: Unremarkable Adrenals/Urinary Tract: Normal adrenal glands. Kidneys enhance symmetrically with contrast. No hydronephrosis. Urinary bladder is unremarkable. Stomach/Bowel: No abnormal bowel wall thickening or evidence for bowel obstruction. Normal appendix. No free fluid or free intraperitoneal air. Normal morphology of the stomach. Vascular/Lymphatic: Normal caliber abdominal aorta. No retroperitoneal lymphadenopathy. Reproductive: Uterus is retroverted. Left adnexal structures are unremarkable. Interval development of a 7.1 x 6.2 cm right adnexal cystic structure (image 75; series 2). Other: None. Musculoskeletal: No aggressive or acute appearing osseous lesions. IMPRESSION: 1. Large right ovarian cyst, better evaluated on same day pelvic ultrasound. 2. Otherwise no acute process within the abdomen or pelvis. Electronically Signed   By: Annia Belt M.D.   On: 07/05/2017 12:57   US Pelvic Doppler (torsion R/o Or Mass Arterial Flow)  Result Date: 07/05/2017 CLINICAL DATA:  Pelvic pain EXAM: TRANSABDOMINAL AND TRANSVAGINAL ULTRASOUND OF PELVIS DOPPLER ULTRASOUND OF OVARIES TECHNIQUE: Study was performed  transabdominally to optimize pelvic field of view evaluation and transvaginally to optimize internal visceral architecture evaluation. Color and duplex Doppler ultrasound was utilized to evaluate blood flow to the ovaries. COMPARISON:  Pelvic ultrasound Jan 12, 2016; CT abdomen and pelvis Jan 12, 2016 FINDINGS: Uterus Measurements: 9.4 x 4.6 x 5.1 cm. No fibroids or other mass visualized. Endometrium Thickness: 12 mm.  No focal abnormality visualized. Right ovary Measurements: 6.7 x 5.2 x 6.3 cm. There is a a cyst arising from the left ovary measuring 6.4 x 4.8 x 5.8 cm. No other right-sided pelvic mass evident. Left ovary Measurements: 2.8 x 1.7 x 1.7 cm. Normal appearance/no adnexal mass. Pulsed Doppler evaluation of both ovaries demonstrates normal low-resistance arterial and venous waveforms. Other findings There is a small amount of free pelvic fluid.  Free fluid. IMPRESSION: 1. There is a cyst arising from the right ovary measuring 6.4 x 4.8 x 5.8 cm. Right ovary otherwise appears normal. 2.  Left ovary appears normal. 3. Small amount of free pelvic fluid may represent recent ovarian leakage or rupture. 4. No ovarian torsion evident. Low resistance waveforms noted in each ovary. 5.  Uterus and endometrium appear normal. The right ovarian cyst is almost certainly benign, but follow up ultrasound is recommended in 1 year according to the Society of Radiologists in Ultrasound2010 Consensus Conference Statement Grier Rocher et  al. Management of Asymptomatic Ovarian and Other Adnexal Cysts Imaged at [place]: Society of Radiologists in Ultrasound Consensus Conference Statement 2010. Radiology 256 (Sept 2010): 943-954.). Electronically Signed   By: Bretta Bang III M.D.   On: 07/05/2017 10:39    ultrasound shows no evidence of torsion. Previous studies done today showing no hydronephrosis  ____________________________________________   PROCEDURES  Procedure(s) performed:   Procedures  Critical Care performed:     ____________________________________________   INITIAL IMPRESSION / ASSESSMENT AND PLAN / ED COURSE  As part of my medical decision making, I reviewed the following data within the electronic MEDICAL RECORD NUMBER {  labs x-rays from this morning were reviewed carefully. there is no obvious explanation for patient's pain exactly consistent may have leaked. the leaking may be what is causing the pain. i will give the patient a hydrocodone. patient says she can tolerate that very well only morphine gives her swelling.  patient has some hematuria but no hydronephrosis and normal ultrasounds I will have her follow-up with her doctor tomorrow return if she is worse. Really not sure what's causing the pain she is having. She does have an appointment with GYN on Tuesday.   I am currently unable to log into the [place] state drug checking site for some reason not quite sure why been having trouble with this for about a week.  ____________________________________________   FINAL CLINICAL IMPRESSION(S) / ED DIAGNOSES  Final diagnoses:  Pelvic pain      NEW MEDICATIONS STARTED DURING THIS VISIT:  New Prescriptions   No medications on file     Note:  This document was prepared using Dragon voice recognition software and may include unintentional dictation errors.    Arnaldo Natal, MD 07/05/17 2317    Arnaldo Natal, MD 07/05/17 [phone]

## 2017-07-05 NOTE — ED Notes (Signed)
Family at bedside. 

## 2017-07-05 NOTE — ED Provider Notes (Signed)
[facility] Emergency Department Provider Note  ____________________________________________  Time seen: Approximately 9:22 AM  I have reviewed the triage vital signs and the nursing notes.   HISTORY  Chief Complaint Abdominal Pain   HPI Alexis [name] is a 31 y.o. female with a history of IBS and Trichomonas, cholecystectomy and tubal ligation who presents for evaluation of suprapubicand vaginal pain. Patient reports that she woke up this morning with those symptoms. She is complaining of a constant pressure-like pain in her vaginal area and suprapubic region. She has had mild discomfort with urination, denies frequency, fever or chills. She has had nausea but no vomiting. The pain is constant and mild to moderate. She denies vaginal discharge or vaginal bleeding. She is sexually active and does not wear condoms.   Past Medical History:  Diagnosis Date  . Anemia   . Hypoglycemia   . Irritable bowel syndrome     There are no active problems to display for this patient.   Past Surgical History:  Procedure Laterality Date  . CHOLECYSTECTOMY    . TUBAL LIGATION      Prior to Admission medications   Medication Sig Start Date End Date Taking? Authorizing Provider  FLUoxetine (PROZAC) 10 MG tablet Take 10 mg by mouth daily.   Yes [provider]  oxyCODONE-acetaminophen (PERCOCET) 5-325 MG tablet Take 2 tablets by mouth every 6 (six) hours as needed for moderate pain or severe pain. Patient not taking: Reported on 07/05/2017 01/12/16   Emily FilbertWilliams, Jonathan E, MD  polyethylene glycol [facility](MIRALAX / Ethelene HalGLYCOLAX) packet Take 17 g by mouth daily. Patient not taking: Reported on 07/05/2017 01/12/16   Emily FilbertWilliams, Jonathan E, MD    Allergies Morphine and related  History reviewed. No pertinent family history.  Social History Social History  Substance Use Topics  . Smoking status: Never Smoker  . Smokeless tobacco: Never Used  . Alcohol use Yes   Comment: rarely    Review of Systems  Constitutional: Negative for fever. Eyes: Negative for visual changes. ENT: Negative for sore throat. Neck: No neck pain  Cardiovascular: Negative for chest pain. Respiratory: Negative for shortness of breath. Gastrointestinal: + suprapubic abdominal pain and nausea. No vomiting or diarrhea. Genitourinary: + dysuria, vaginal pain Musculoskeletal: Negative for back pain. Skin: Negative for rash. Neurological: Negative for headaches, weakness or numbness. Psych: No SI or HI  ____________________________________________   PHYSICAL EXAM:  VITAL SIGNS: ED Triage Vitals [07/05/17 0910]  Enc Vitals Group     BP 115/66     Pulse Rate 68     Resp 18     Temp 98.4 F (36.9 C)     Temp Source Oral     SpO2 98 %     Weight 198 lb (89.8 kg)     Height 5\' 9"  (1.753 m)     Head Circumference      Peak Flow      Pain Score 4     Pain Loc      Pain Edu?      Excl. in GC?     Constitutional: Alert and oriented. Well appearing and in no apparent distress. HEENT:      Head: Normocephalic and atraumatic.         Eyes: Conjunctivae are normal. Sclera is non-icteric.       Mouth/Throat: Mucous membranes are moist.       Neck: Supple with no signs of meningismus. Cardiovascular: Regular rate and rhythm. No murmurs, gallops, or  rubs. 2+ symmetrical distal pulses are present in all extremities. No JVD. Respiratory: Normal respiratory effort. Lungs are clear to auscultation bilaterally. No wheezes, crackles, or rhonchi.  Gastrointestinal: Soft, ttp over the suprapubic and RLQ/ R pelvic region, and non distended with positive bowel sounds. No rebound or guarding. Pelvic exam: Normal external genitalia, no rashes or lesions. Normal cervical mucus. Os closed with lesion seen at 6 o'clock. No cervical motion tenderness. R adnexal tenderness.  Musculoskeletal: Nontender with normal range of motion in all extremities. No edema, cyanosis, or erythema of  extremities. Neurologic: Normal speech and language. Face is symmetric. Moving all extremities. No gross focal neurologic deficits are appreciated. Skin: Skin is warm, dry and intact. No rash noted. Psychiatric: Mood and affect are normal. Speech and behavior are normal.  ____________________________________________   LABS (all labs ordered are listed, but only abnormal results are displayed)  Labs Reviewed  WET PREP, GENITAL - Abnormal; Notable for the following:       Result Value   Clue Cells Wet Prep HPF POC PRESENT (*)    WBC, Wet Prep HPF POC MODERATE (*)    All other components within normal limits  COMPREHENSIVE METABOLIC PANEL - Abnormal; Notable for the following:    Glucose, Bld 102 (*)    All other components within normal limits  URINALYSIS, COMPLETE (UACMP) WITH MICROSCOPIC - Abnormal; Notable for the following:    Color, Urine YELLOW (*)    APPearance HAZY (*)    Leukocytes, UA MODERATE (*)    Squamous Epithelial / LPF 6-30 (*)    All other components within normal limits  CHLAMYDIA/NGC RT PCR ([facility] ONLY)  CBC WITH DIFFERENTIAL/PLATELET  HCG, QUANTITATIVE, PREGNANCY  POCT PREGNANCY, URINE   ____________________________________________  EKG  none  ____________________________________________  RADIOLOGY  TVUS: 1. There is a cyst arising from the right ovary measuring 6.4 x 4.8 x 5.8 cm. Right ovary otherwise appears normal. 2. Left ovary appears normal. 3. Small amount of free pelvic fluid may represent recent ovarian leakage or rupture. 4. No ovarian torsion evident. Low resistance waveforms noted in each ovary. 5. Uterus and endometrium appear normal.   CT a/p: 1. Large right ovarian cyst, better evaluated on same day pelvic ultrasound. 2. Otherwise no acute process within the abdomen or pelvis. ____________________________________________   PROCEDURES  Procedure(s) performed: None Procedures Critical Care performed:   None ____________________________________________   INITIAL IMPRESSION / ASSESSMENT AND PLAN / ED COURSE  31 y.o. female with a history of IBS and Trichomonas, cholecystectomy and tubal ligation who presents for evaluation of suprapubicand vaginal pain and dysuria since this am. Patient is well-appearing, in no distress, is normal vital signs, she is tender to palpation on the suprapubic and right lower quadrant/pelvic region. Differential diagnosis including UTI versus STD versus TOA versus ectopic versus ovarian cyst. Pain is mild and associated with urinary symptoms making torsion less likely at this time. We'll perform a pelvic exam, wet prep and gonorrhea and chlamydia swabs. We'll check basic blood work and urinalysis. We'll check pregnancy test. Will treat symptoms with toradol and zofran  Clinical Course as of Jul 06 1311  Fri Jul 05, 2017  1129 [place] concerning for a cyst in the R ovary with no evidence of torsion of TOA.   [CV]    Clinical Course User Index [CV] Don Perking, [place], MD   _________________________ 1:11 PM on 07/05/2017 ----------------------------------------- Patient continued to have significant tenderness palpation on the right lower quadrant and she was sent for a  CT to rule out appendicitis. CT is negative for appendicitis. Workup concerning for large right adnexal cyst. Discussed signs and symptoms of torsion with patient and explained to her that she is at higher risk based on the size of the cyst and therefore she needs to return if she develops new or severe pain in the right lower quadrant. I am referring her to see OB/GYN for evaluation of the lesion seen in her cervix and also of this large cyst.  As part of my medical decision making, I reviewed the following data within the electronic MEDICAL RECORD NUMBER Nursing notes reviewed and incorporated, Labs reviewed , Radiograph reviewed , Notes from prior ED visits and [place] Controlled Substance Database    Pertinent  labs & imaging results that were available during my care of the patient were reviewed by me and considered in my medical decision making (see chart for details).    ____________________________________________   FINAL CLINICAL IMPRESSION(S) / ED DIAGNOSES  Final diagnoses:  Right adnexal tenderness  Right ovarian cyst  Abnormality of uterine cervix      NEW MEDICATIONS STARTED DURING THIS VISIT:  New Prescriptions   No medications on file     Note:  This document was prepared using Dragon voice recognition software and may include unintentional dictation errors.    Nita Sickle, MD 07/05/17 [phone]

## 2017-07-05 NOTE — ED Notes (Signed)
Patient transported to Ultrasound 

## 2017-07-05 NOTE — ED Notes (Signed)
Returned from CT.

## 2017-07-05 NOTE — ED Notes (Signed)
Pt eating a bag of potato chips and drinking a pepsi at time of being called to room. Pt asked to not eat or drink in case of surgical need by triage RN Eileen StanfordJenna. Pt informed this RN that "had to have something on my stomach".  MD Malinda made aware.

## 2017-07-05 NOTE — ED Notes (Signed)
Pt reports lower abdominal pain since this morning. Pt reports pain vaginally while sitting. Pt has hx of trichomonas and was treated for it.  Pt c/o suprapubic pain also.  Pt does report some vomiting. Pt is tender in RLQ.

## 2017-07-05 NOTE — ED Notes (Signed)
Transported to [place].

## 2017-07-09 ENCOUNTER — Encounter: Payer: Self-pay | Admitting: Obstetrics and Gynecology

## 2017-07-09 ENCOUNTER — Ambulatory Visit (INDEPENDENT_AMBULATORY_CARE_PROVIDER_SITE_OTHER): Payer: BLUE CROSS/BLUE SHIELD | Admitting: Obstetrics and Gynecology

## 2017-07-09 VITALS — BP 110/70 | HR 74 | Ht 69.0 in | Wt 216.6 lb

## 2017-07-09 DIAGNOSIS — N83201 Unspecified ovarian cyst, right side: Secondary | ICD-10-CM

## 2017-07-09 DIAGNOSIS — R102 Pelvic and perineal pain: Secondary | ICD-10-CM | POA: Diagnosis not present

## 2017-07-09 NOTE — Progress Notes (Signed)
HPI:      Alexis [name] is a 31 y.o. [phone] who LMP was Patient's last menstrual period was 07/08/2017 (exact date).  Subjective:   She presents today as a follow-up to emergency department visit.  She presented there with pelvic pain mostly on the left.  She was found to have a right ovarian cyst.  It was a simple with no evidence of malignancy.  Dopplers were negative.  Since that time the patient has some right-sided pelvic pressure and some left-sided pelvic pain but it is not disabling.  She states that she has had several cyst like this in the past. She has a tubal ligation for birth control. She reports that she has not had a Pap smear in more than 7 years and prior to that was diagnosed with HPV.  She states that the emergency department doctor told her that she saw something on her cervix and she needed a Pap smear as soon as possible.  She is currently bleeding today and refusing examination.    Hx: The following portions of the patient's history were reviewed and updated as appropriate:             She  has a past medical history of Anemia; Hypoglycemia; and Irritable bowel syndrome. She  does not have a problem list on file. She  has a past surgical history that includes Tubal ligation and Cholecystectomy. Her family history includes Breast cancer in her maternal grandmother; Cancer in her maternal aunt and paternal aunt; Diabetes in her father and paternal grandfather. She  reports that she has never smoked. She has never used smokeless tobacco. She reports that she drinks alcohol. She reports that she does not use drugs. She is allergic to morphine and related.       Review of Systems:  Review of Systems  Constitutional: Denied constitutional symptoms, night sweats, recent illness, fatigue, fever, insomnia and weight loss.  Eyes: Denied eye symptoms, eye pain, photophobia, vision change and visual disturbance.  Ears/Nose/Throat/Neck: Denied ear, nose, throat or neck  symptoms, hearing loss, nasal discharge, sinus congestion and sore throat.  Cardiovascular: Denied cardiovascular symptoms, arrhythmia, chest pain/pressure, edema, exercise intolerance, orthopnea and palpitations.  Respiratory: Denied pulmonary symptoms, asthma, pleuritic pain, productive sputum, cough, dyspnea and wheezing.  Gastrointestinal: Denied, gastro-esophageal reflux, melena, nausea and vomiting.  Genitourinary: See HPI for additional information.  Musculoskeletal: Denied musculoskeletal symptoms, stiffness, swelling, muscle weakness and myalgia.  Dermatologic: Denied dermatology symptoms, rash and scar.  Neurologic: Denied neurology symptoms, dizziness, headache, neck pain and syncope.  Psychiatric: Denied psychiatric symptoms, anxiety and depression.  Endocrine: Denied endocrine symptoms including hot flashes and night sweats.   Meds:   Current Outpatient Prescriptions on File Prior to Visit  Medication Sig Dispense Refill  . FLUoxetine (PROZAC) 10 MG capsule Take 10 mg by mouth daily.    . meloxicam (MOBIC) 7.5 MG tablet Take 7.5 mg by mouth daily as needed for pain.    . traZODone (DESYREL) 50 MG tablet Take 50 mg by mouth at bedtime as needed for sleep.     No current facility-administered medications on file prior to visit.     Objective:     Vitals:   07/09/17 1519  BP: 110/70  Pulse: 74              Patient declined examination and Pap today  Assessment:    G3P3003 There are no active problems to display for this patient.    1. Pelvic pain  in female   2. Cyst of right ovary     Simple cyst negative Dopplers.  Pain is not disabling.    Plan:            1.  Follow-up for annual examination and Pap smear next week.  2.  Follow-up ultrasound for right ovarian cyst in 6 weeks. Orders No orders of the defined types were placed in this encounter.   No orders of the defined types were placed in this encounter.     F/U  Return in about 1 week (around  07/16/2017). I spent 33 minutes with this patient of which greater than 50% was spent discussing ovarian cysts, pelvic pain, use of OCPs for cyst, abnormal Pap smears, dysplasia, HPV.  Management ovarian cyst and follow-up were discussed in detail with the patient all her questions were answered.  Elonda Huskyavid J. Tallie Dodds, M.D. 07/09/2017 3:49 PM

## 2017-07-16 ENCOUNTER — Encounter: Payer: Self-pay | Admitting: Obstetrics and Gynecology

## 2017-07-16 ENCOUNTER — Ambulatory Visit (INDEPENDENT_AMBULATORY_CARE_PROVIDER_SITE_OTHER): Payer: BLUE CROSS/BLUE SHIELD | Admitting: Obstetrics and Gynecology

## 2017-07-16 ENCOUNTER — Other Ambulatory Visit (INDEPENDENT_AMBULATORY_CARE_PROVIDER_SITE_OTHER): Payer: BLUE CROSS/BLUE SHIELD

## 2017-07-16 VITALS — BP 106/70 | HR 67 | Ht 69.0 in | Wt 215.2 lb

## 2017-07-16 DIAGNOSIS — N83201 Unspecified ovarian cyst, right side: Secondary | ICD-10-CM

## 2017-07-16 DIAGNOSIS — R102 Pelvic and perineal pain: Secondary | ICD-10-CM

## 2017-07-16 DIAGNOSIS — Z0000 Encounter for general adult medical examination without abnormal findings: Secondary | ICD-10-CM

## 2017-07-16 MED ORDER — OXYCODONE-ACETAMINOPHEN 5-325 MG PO TABS: 1.0000 | ORAL_TABLET | ORAL | 0 refills | Status: DC | PRN

## 2017-07-16 MED ORDER — OXYCODONE-ACETAMINOPHEN 5-325 MG PO TABS
1.0000 | ORAL_TABLET | ORAL | 0 refills | Status: DC | PRN
Start: 2017-07-16 — End: 2018-04-01

## 2017-07-16 NOTE — Addendum Note (Signed)
Addended by: Brooke DareSICK, Pualani Borah L on: 07/16/2017 11:32 AM   Modules accepted: Orders

## 2017-07-16 NOTE — Addendum Note (Signed)
Addended by: Elonda HuskyEVANS, Jadan Rouillard J on: 07/16/2017 04:46 PM   Modules accepted: Orders

## 2017-07-16 NOTE — Progress Notes (Addendum)
HPI:      [name] is a 31 y.o. [phone] who LMP was Patient's last menstrual period was 07/08/2017 (exact date).  Subjective:   She presents today for her annual examination.  However, she continues to complain of severe right lower quadrant pain which has become worse over the last 24 hours.  She says she can no longer function like this.  She was seen approximately 10 days ago for this same issue as a follow-up to an emergency department visit.  Her pain improved over several days but has recently become worse.  Reports it is not affected by eating bowel movements or urination.  She has tried heating pads or warm baths ibuprofen without success. She has a tubal ligation for birth control.     Hx: The following portions of the patient's history were reviewed and updated as appropriate:             She  has a past medical history of Anemia, Hypoglycemia, and Irritable bowel syndrome. She does not have a problem list on file. She  has a past surgical history that includes Tubal ligation and Cholecystectomy. Her family history includes Breast cancer in her maternal grandmother; Cancer in her maternal aunt and paternal aunt; Diabetes in her father and paternal grandfather. She  reports that  has never smoked. she has never used smokeless tobacco. She reports that she drinks alcohol. She reports that she does not use drugs. She is allergic to morphine and related.       Review of Systems:  Review of Systems  Constitutional: Denied constitutional symptoms, night sweats, recent illness, fatigue, fever, insomnia and weight loss.  Eyes: Denied eye symptoms, eye pain, photophobia, vision change and visual disturbance.  Ears/Nose/Throat/Neck: Denied ear, nose, throat or neck symptoms, hearing loss, nasal discharge, sinus congestion and sore throat.  Cardiovascular: Denied cardiovascular symptoms, arrhythmia, chest pain/pressure, edema, exercise intolerance, orthopnea and palpitations.   Respiratory: Denied pulmonary symptoms, asthma, pleuritic pain, productive sputum, cough, dyspnea and wheezing.  Gastrointestinal: Denied, gastro-esophageal reflux, melena, nausea and vomiting.  Genitourinary: See HPI for additional information.  Musculoskeletal: Denied musculoskeletal symptoms, stiffness, swelling, muscle weakness and myalgia.  Dermatologic: Denied dermatology symptoms, rash and scar.  Neurologic: Denied neurology symptoms, dizziness, headache, neck pain and syncope.  Psychiatric: Denied psychiatric symptoms, anxiety and depression.  Endocrine: Denied endocrine symptoms including hot flashes and night sweats.   Meds:   Current Outpatient Medications on File Prior to Visit  Medication Sig Dispense Refill  . FLUoxetine (PROZAC) 10 MG capsule Take 10 mg by mouth daily.    . traZODone (DESYREL) 50 MG tablet Take 50 mg by mouth at bedtime as needed for sleep.     No current facility-administered medications on file prior to visit.     Objective:     Vitals:   07/16/17 0807  BP: 106/70  Pulse: 67              Physical examination General NAD, Conversant  HEENT Atraumatic; Op clear with mmm.  Normo-cephalic. Pupils reactive. Anicteric sclerae  Thyroid/Neck Smooth without nodularity or enlargement. Normal ROM.  Neck Supple.  Skin No rashes, lesions or ulceration. Normal palpated skin turgor. No nodularity.  Breasts: No masses or discharge.  Symmetric.  No axillary adenopathy.  Lungs: Clear to auscultation.No rales or wheezes. Normal Respiratory effort, no retractions.  Heart: NSR.  No murmurs or rubs appreciated. No periferal edema  Abdomen: Soft.  Non-tender.  No masses.  No HSM. No  hernia  Extremities: Moves all appropriately.  Normal ROM for age. No lymphadenopathy.  Neuro: Oriented to PPT.  Normal mood. Normal affect.     Pelvic:   Vulva: Normal appearance.  No lesions.  Vagina: No lesions or abnormalities noted.  Support: Normal pelvic support.  Urethra No  masses tenderness or scarring.  Meatus Normal size without lesions or prolapse.  Cervix: Normal appearance.  No lesions.  Anus: Normal exam.  No lesions.  Perineum: Normal exam.  No lesions.        Bimanual   Uterus: Normal size.  Non-tender.  Mobile.  AV.  Adnexae:  Patient unable to tolerate adnexal exam because of her pain. R>L  Cul-de-sac: Negative for abnormality.      Assessment:    G3P3003 There are no active problems to display for this patient.    1. Encounter for annual physical exam   2. Pelvic pain in female   3. Cyst of right ovary     The plan was to undergo expectant management of the cyst knowing that it was simple and that it would eventually resolve.  The patient has now had increasing pain and it has become very limiting.  She is considering and requesting surgery.   Plan:            1.  Ultrasound today for follow-up of cyst.    2.  Consider right oophorectomy.    3.  Basic Screening Recommendations The basic screening recommendations for asymptomatic women were discussed with the patient during her visit.  The age-appropriate recommendations were discussed with her and the rational for the tests reviewed.  When I am informed by the patient that another primary care physician has previously obtained the age-appropriate tests and they are up-to-date, only outstanding tests are ordered and referrals given as necessary.  Abnormal results of tests will be discussed with her when all of her results are completed. Pap performed.  Orders Orders Placed This Encounter  Procedures  . US PELVIS (TRANSABDOMINAL ONLY)    No orders of the defined types were placed in this encounter.       F/U  No Follow-up on file. Greater than 18 minutes direct contact with the patient was spent discussing her pelvic pain issues and right ovarian cyst.  Management as well as options discussed. Elonda Huskyavid J. Zayquan Bogard, M.D. 07/16/2017 8:40 AM   Addendum  4:40 PM Call patient regarding  her ultrasound results.  Significant decrease in size.  Consideration for possible slow leak causing increased pain over the last 24 hours. 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  Expectation that she will return to work on Thursday and pain will be improved. No indication for surgery at this rapid reduction in size of the cyst.

## 2017-07-16 NOTE — Addendum Note (Signed)
Addended by: Marchelle FolksMILLER, Rayven Hendrickson G on: 07/16/2017 04:58 PM   Modules accepted: Orders

## 2017-07-16 NOTE — Addendum Note (Signed)
Addended by: Brooke DareSICK, Sou Nohr L on: 07/16/2017 04:58 PM   Modules accepted: Orders

## 2017-07-17 ENCOUNTER — Other Ambulatory Visit: Payer: BLUE CROSS/BLUE SHIELD

## 2017-07-18 ENCOUNTER — Inpatient Hospital Stay: Admission: RE | Admit: 2017-07-18 | Payer: BLUE CROSS/BLUE SHIELD | Source: Ambulatory Visit

## 2017-07-18 LAB — PAP IG AND HPV HIGH-RISK
HPV, high-risk: NEGATIVE
PAP Smear Comment: 0

## 2017-07-19 ENCOUNTER — Ambulatory Visit
Admission: RE | Admit: 2017-07-19 | Payer: BLUE CROSS/BLUE SHIELD | Source: Ambulatory Visit | Admitting: Obstetrics and Gynecology

## 2017-07-19 ENCOUNTER — Telehealth: Payer: Self-pay

## 2017-07-19 ENCOUNTER — Encounter: Admission: RE | Payer: Self-pay | Source: Ambulatory Visit

## 2017-07-19 SURGERY — SALPINGO-OOPHORECTOMY, LAPAROSCOPIC
Anesthesia: General | Laterality: Right

## 2017-07-19 NOTE — Telephone Encounter (Signed)
-----   Message from Linzie Collinavid James Evans, MD sent at 07/19/2017 11:12 AM EST ----- Negative Pap and HPV

## 2017-07-19 NOTE — Telephone Encounter (Signed)
Message left on pts voicemail- neg test results per provider 

## 2017-10-05 ENCOUNTER — Emergency Department
Admission: EM | Admit: 2017-10-05 | Discharge: 2017-10-05 | Disposition: A | Discharge status: Home / Self Care | Payer: BLUE CROSS/BLUE SHIELD | Attending: Emergency Medicine | Admitting: Emergency Medicine

## 2017-10-05 ENCOUNTER — Encounter: Payer: Self-pay | Admitting: Intensive Care

## 2017-10-05 DIAGNOSIS — B349 Viral infection, unspecified: Secondary | ICD-10-CM | POA: Insufficient documentation

## 2017-10-05 DIAGNOSIS — R0981 Nasal congestion: Secondary | ICD-10-CM | POA: Diagnosis present

## 2017-10-05 DIAGNOSIS — Z79899 Other long term (current) drug therapy: Secondary | ICD-10-CM | POA: Insufficient documentation

## 2017-10-05 LAB — INFLUENZA PANEL BY PCR (TYPE A & B)
INFLAPCR: NEGATIVE
INFLBPCR: NEGATIVE

## 2017-10-05 MED ORDER — PROMETHAZINE-DM 6.25-15 MG/5ML PO SYRP: 5.0000 mL | ORAL_SOLUTION | Freq: Four times a day (QID) | ORAL | 0 refills | Status: DC | PRN

## 2017-10-05 MED ORDER — IBUPROFEN 800 MG PO TABS: 800.0000 mg | ORAL_TABLET | Freq: Three times a day (TID) | ORAL | 0 refills | Status: DC | PRN

## 2017-10-05 MED ORDER — ONDANSETRON 8 MG PO TBDP
8.0000 mg | ORAL_TABLET | Freq: Once | ORAL | Status: AC
  Administered 2017-10-05: 8 mg via ORAL
  Filled 2017-10-05: qty 1

## 2017-10-05 MED ORDER — KETOROLAC TROMETHAMINE 60 MG/2ML IM SOLN
60.0000 mg | Freq: Once | INTRAMUSCULAR | Status: AC
  Administered 2017-10-05: 60 mg via INTRAMUSCULAR
  Filled 2017-10-05: qty 2

## 2017-10-05 NOTE — ED Triage Notes (Signed)
Patient c/o runny nose, congestion, nausea, and body aches.

## 2017-10-05 NOTE — ED Provider Notes (Signed)
[facility] Emergency Department Provider Note   ____________________________________________   First MD Initiated Contact with Patient 10/05/17 1038     (approximate)  I have reviewed the triage vital signs and the nursing notes.   HISTORY  Chief Complaint No chief complaint on file.    HPI Alexis [name] is a 32 y.o. female patient complain of nasal congestion intermittent runny nose.  Patient also has nausea but denies vomiting diarrhea.  Patient has body ache.  Patient rates her pain as a 2/10.  Patient described the pain is generalized body aches.  No palates measured for complaint.  Patient had taken a flu shot for this season.  Past Medical History:  Diagnosis Date  . Anemia   . Hypoglycemia   . Irritable bowel syndrome     There are no active problems to display for this patient.   Past Surgical History:  Procedure Laterality Date  . CHOLECYSTECTOMY    . TUBAL LIGATION      Prior to Admission medications   Medication Sig Start Date End Date Taking? Authorizing Provider  FLUoxetine (PROZAC) 10 MG capsule Take 10 mg by mouth daily.    [provider]  ibuprofen (ADVIL,MOTRIN) 800 MG tablet Take 1 tablet (800 mg total) by mouth every 8 (eight) hours as needed for moderate pain. 10/05/17   Joni ReiningSmith, Ronald K, PA-C  oxyCODONE-acetaminophen (PERCOCET/ROXICET) 5-325 MG tablet Take 1 tablet every 4 (four) hours as needed by mouth for severe pain. 07/16/17   Linzie CollinEvans, David James, MD  promethazine-dextromethorphan (PROMETHAZINE-DM) 6.25-15 MG/5ML syrup Take 5 mLs by mouth 4 (four) times daily as needed for cough. 10/05/17   Joni ReiningSmith, Ronald K, PA-C  traZODone (DESYREL) 50 MG tablet Take 50 mg by mouth at bedtime as needed for sleep.    [provider]    Allergies Morphine and related  Family History  Problem Relation Age of Onset  . Diabetes Father   . Cancer Maternal Aunt   . Cancer Paternal Aunt   . Breast cancer Maternal  Grandmother   . Diabetes Paternal Grandfather     Social History Social History   Tobacco Use  . Smoking status: Never Smoker  . Smokeless tobacco: Never Used  Substance Use Topics  . Alcohol use: Yes    Comment: rarely  . Drug use: No    Review of Systems Constitutional: Chills and body aches eyes: No visual changes. ENT: No sore throat. Cardiovascular: Denies chest pain. Respiratory: Denies shortness of breath.  Nonproductive cough Gastrointestinal: No abdominal pain.   nausea, no vomiting.  No diarrhea.  No constipation. Genitourinary: Negative for dysuria. Musculoskeletal: Negative for back pain. Skin: Negative for rash. Neurological: Negative for headaches, focal weakness or numbness.   ____________________________________________   PHYSICAL EXAM:  VITAL SIGNS: ED Triage Vitals  Enc Vitals Group     BP 10/05/17 1012 (!) 120/59     Pulse Rate 10/05/17 1012 79     Resp 10/05/17 1012 20     Temp 10/05/17 1012 98.2 F (36.8 C)     Temp Source 10/05/17 1012 Oral     SpO2 10/05/17 1012 97 %     Weight 10/05/17 1012 215 lb (97.5 kg)     Height 10/05/17 1012 5\' 8"  (1.727 m)     Head Circumference --      Peak Flow --      Pain Score 10/05/17 1015 2     Pain Loc --  Pain Edu? --      Excl. in GC? --    Constitutional: Alert and oriented. Well appearing and in no acute distress. Nose: Edematous nasal turbinates clear rhinorrhea Mouth/Throat: Mucous membranes are moist.  Oropharynx non-erythematous. Neck: No stridor.   Cardiovascular: Normal rate, regular rhythm. Grossly normal heart sounds.  Good peripheral circulation. Respiratory: Normal respiratory effort.  No retractions. Lungs bilateral wheezes. Gastrointestinal: Soft and nontender. No distention. No abdominal bruits. No CVA tenderness. Musculoskeletal: No lower extremity tenderness nor edema.  No joint effusions. Neurologic:  Normal speech and language. No gross focal neurologic deficits are  appreciated. No gait instability. Skin:  Skin is warm, dry and intact. No rash noted. Psychiatric: Mood and affect are normal. Speech and behavior are normal.  ____________________________________________   LABS (all labs ordered are listed, but only abnormal results are displayed)  Labs Reviewed  INFLUENZA PANEL BY PCR (TYPE A & B)   ____________________________________________  EKG   ____________________________________________  RADIOLOGY  No results found.  ____________________________________________   PROCEDURES  Procedure(s) performed:   Procedures  Critical Care performed: No  ____________________________________________   INITIAL IMPRESSION / ASSESSMENT AND PLAN / ED COURSE  As part of my medical decision making, I reviewed the following data within the electronic MEDICAL RECORD NUMBER    Viral illness.  Discussed patient negative flu test.  Patient given discharge care instruction work note.  Patient advised take medication as directed.  Patient advised follow-up PCP if condition persists.      ____________________________________________   FINAL CLINICAL IMPRESSION(S) / ED DIAGNOSES  Final diagnoses:  Viral illness     ED Discharge Orders        Ordered    promethazine-dextromethorphan (PROMETHAZINE-DM) 6.25-15 MG/5ML syrup  4 times daily PRN     10/05/17 1214    ibuprofen (ADVIL,MOTRIN) 800 MG tablet  Every 8 hours PRN     10/05/17 1214       Note:  This document was prepared using Dragon voice recognition software and may include unintentional dictation errors.    Joni Reining, PA-C 10/05/17 1217    Arnaldo Natal, MD 10/05/17 [phone]

## 2017-11-15 ENCOUNTER — Other Ambulatory Visit: Payer: Self-pay

## 2017-11-15 ENCOUNTER — Emergency Department
Admission: EM | Admit: 2017-11-15 | Discharge: 2017-11-15 | Disposition: A | Discharge status: Home / Self Care | Payer: BLUE CROSS/BLUE SHIELD | Attending: Emergency Medicine | Admitting: Emergency Medicine

## 2017-11-15 DIAGNOSIS — R110 Nausea: Secondary | ICD-10-CM | POA: Diagnosis present

## 2017-11-15 DIAGNOSIS — Z79899 Other long term (current) drug therapy: Secondary | ICD-10-CM | POA: Insufficient documentation

## 2017-11-15 DIAGNOSIS — K529 Noninfective gastroenteritis and colitis, unspecified: Secondary | ICD-10-CM | POA: Diagnosis not present

## 2017-11-15 LAB — COMPREHENSIVE METABOLIC PANEL
ALBUMIN: 4.2 g/dL (ref 3.5–5.0)
ALT: 18 U/L (ref 14–54)
AST: 20 U/L (ref 15–41)
Alkaline Phosphatase: 58 U/L (ref 38–126)
Anion gap: 8 (ref 5–15)
BUN: 9 mg/dL (ref 6–20)
CHLORIDE: 105 mmol/L (ref 101–111)
CO2: 25 mmol/L (ref 22–32)
CREATININE: 0.78 mg/dL (ref 0.44–1.00)
Calcium: 8.9 mg/dL (ref 8.9–10.3)
GFR calc Af Amer: >60 mL/min (ref >60)
GLUCOSE: 105 mg/dL — AB (ref 65–99)
POTASSIUM: 3.4 mmol/L — AB (ref 3.5–5.1)
Sodium: 138 mmol/L (ref 135–145)
Total Bilirubin: 0.6 mg/dL (ref 0.3–1.2)
Total Protein: 7.1 g/dL (ref 6.5–8.1)

## 2017-11-15 LAB — CBC
HEMATOCRIT: 40.5 % (ref 35.0–47.0)
Hemoglobin: 13.8 g/dL (ref 12.0–16.0)
MCH: 28.6 pg (ref 26.0–34.0)
MCHC: 34.1 g/dL (ref 32.0–36.0)
MCV: 83.9 fL (ref 80.0–100.0)
PLATELETS: 213 10*3/uL (ref 150–440)
RBC: 4.83 MIL/uL (ref 3.80–5.20)
RDW: 13.1 % (ref 11.5–14.5)
WBC: 6.3 10*3/uL (ref 3.6–11.0)

## 2017-11-15 LAB — URINALYSIS, COMPLETE (UACMP) WITH MICROSCOPIC
BACTERIA UA: NONE SEEN
Bilirubin Urine: NEGATIVE
GLUCOSE, UA: NEGATIVE mg/dL
Hgb urine dipstick: NEGATIVE
KETONES UR: NEGATIVE mg/dL
Leukocytes, UA: NEGATIVE
Nitrite: NEGATIVE
PH: 6 (ref 5.0–8.0)
Protein, ur: NEGATIVE mg/dL
SPECIFIC GRAVITY, URINE: 1.009 (ref 1.005–1.030)

## 2017-11-15 LAB — LIPASE, BLOOD: LIPASE: 39 U/L (ref 11–51)

## 2017-11-15 LAB — POCT PREGNANCY, URINE: Preg Test, Ur: NEGATIVE

## 2017-11-15 MED ORDER — ONDANSETRON 4 MG PO TBDP
4.0000 mg | ORAL_TABLET | Freq: Once | ORAL | Status: AC
  Administered 2017-11-15: 4 mg via ORAL
  Filled 2017-11-15: qty 1

## 2017-11-15 MED ORDER — LOPERAMIDE HCL 2 MG PO TABS: 2.0000 mg | ORAL_TABLET | Freq: Four times a day (QID) | ORAL | 0 refills | Status: DC | PRN

## 2017-11-15 MED ORDER — ONDANSETRON 4 MG PO TBDP
4.0000 mg | ORAL_TABLET | Freq: Three times a day (TID) | ORAL | 0 refills | Status: DC | PRN
Start: 2017-11-15 — End: 2018-03-25

## 2017-11-15 NOTE — ED Provider Notes (Signed)
[facility] Emergency Department Provider Note  Time seen: 8:15 PM  I have reviewed the triage vital signs and the nursing notes.   HISTORY  Chief Complaint Nausea and Diarrhea    HPI Alexis [name] is a 32 y.o. female with a past medical history of IBS who presents the emergency department for nausea and diarrhea.  According to the patient she awoke early this morning with diarrhea.  States she has felt somewhat nauseated throughout the day but denies any vomiting.  States she has had very frequent episodes of diarrhea today and had to leave work so she came to the emergency department.  Patient states diarrhea is a greenish color.  Denies any black or bloody stool.  States abdominal cramping but denies any "pain."  Denies any fever.   Past Medical History:  Diagnosis Date  . Anemia   . Hypoglycemia   . Irritable bowel syndrome     There are no active problems to display for this patient.   Past Surgical History:  Procedure Laterality Date  . CHOLECYSTECTOMY    . TUBAL LIGATION      Prior to Admission medications   Medication Sig Start Date End Date Taking? Authorizing Provider  FLUoxetine (PROZAC) 10 MG capsule Take 10 mg by mouth daily.    [provider]  ibuprofen (ADVIL,MOTRIN) 800 MG tablet Take 1 tablet (800 mg total) by mouth every 8 (eight) hours as needed for moderate pain. 10/05/17   Joni ReiningSmith, Ronald K, PA-C  oxyCODONE-acetaminophen (PERCOCET/ROXICET) 5-325 MG tablet Take 1 tablet every 4 (four) hours as needed by mouth for severe pain. 07/16/17   Linzie CollinEvans, David James, MD  promethazine-dextromethorphan (PROMETHAZINE-DM) 6.25-15 MG/5ML syrup Take 5 mLs by mouth 4 (four) times daily as needed for cough. 10/05/17   Joni ReiningSmith, Ronald K, PA-C  traZODone (DESYREL) 50 MG tablet Take 50 mg by mouth at bedtime as needed for sleep.    [provider]    Allergies  Allergen Reactions  . Morphine And Related Swelling    Family History   Problem Relation Age of Onset  . Diabetes Father   . Cancer Maternal Aunt   . Cancer Paternal Aunt   . Breast cancer Maternal Grandmother   . Diabetes Paternal Grandfather     Social History Social History   Tobacco Use  . Smoking status: Never Smoker  . Smokeless tobacco: Never Used  Substance Use Topics  . Alcohol use: Yes    Comment: rarely  . Drug use: No    Review of Systems Constitutional: Negative for fever. Eyes: Negative for visual complaints ENT: Negative for recent illness/congestion Cardiovascular: Negative for chest pain. Respiratory: Negative for shortness of breath. Gastrointestinal: Abdominal cramping.  Positive for diarrhea.  Positive for nausea but negative for vomiting. Genitourinary: Negative for urinary compaints Musculoskeletal: Negative for musculoskeletal complaints Skin: Negative for skin complaints  Neurological: Negative for headache All other ROS negative  ____________________________________________   PHYSICAL EXAM:  VITAL SIGNS: ED Triage Vitals  Enc Vitals Group     BP 11/15/17 1812 119/67     Pulse Rate 11/15/17 1812 89     Resp 11/15/17 1812 18     Temp 11/15/17 1812 98.9 F (37.2 C)     Temp Source 11/15/17 1812 Oral     SpO2 11/15/17 1812 93 %     Weight 11/15/17 1812 215 lb (97.5 kg)     Height 11/15/17 1812 5\' 9"  (1.753 m)     Head Circumference --  Peak Flow --      Pain Score 11/15/17 1822 4     Pain Loc --      Pain Edu? --      Excl. in GC? --     Constitutional: Alert and oriented. Well appearing and in no distress. Eyes: Normal exam ENT   Head: Normocephalic and atraumatic.   Mouth/Throat: Mucous membranes are moist. Cardiovascular: Normal rate, regular rhythm. No murmur Respiratory: Normal respiratory effort without tachypnea nor retractions. Breath sounds are clear  Gastrointestinal: Soft, no reaction to deep palpation, states mild tenderness diffusely.  No distention.  No rebound or  guarding. Musculoskeletal: Nontender with normal range of motion in all extremities. Neurologic:  Normal speech and language. No gross focal neurologic deficits Skin:  Skin is warm, dry and intact.  Psychiatric: Mood and affect are normal.    INITIAL IMPRESSION / ASSESSMENT AND PLAN / ED COURSE  Pertinent labs & imaging results that were available during my care of the patient were reviewed by me and considered in my medical decision making (see chart for details).  Patient presents to the emergency department for nausea and diarrhea.  Differential would include gastroenteritis, enteritis, intra-abdominal pathology, infectious etiology.  Patient's lab work is reassuring, normal white blood cell count, normal chemistry.  Patient has an overall normal physical exam, states mild abdominal cramping but denies any pain.  Symptoms are very suggestive of gastroenteritis.  We will discharge with loperamide and Zofran to be used as needed, supportive care at home including plenty of hydration.  Patient agreeable to this plan of care.  I discussed return precautions for any fever or abdominal pain.  ____________________________________________   FINAL CLINICAL IMPRESSION(S) / ED DIAGNOSES  Gastroenteritis    Minna Antis, MD 11/15/17 2018

## 2017-11-15 NOTE — ED Triage Notes (Signed)
Nausea and diarrhea that started last night.  generalized abdominal pain. Pt alert and oriented X4, active, cooperative, pt in NAD. RR even and unlabored, color WNL.

## 2018-03-25 ENCOUNTER — Other Ambulatory Visit: Payer: Self-pay

## 2018-03-25 ENCOUNTER — Encounter: Payer: Self-pay | Admitting: Emergency Medicine

## 2018-03-25 ENCOUNTER — Emergency Department: Payer: BLUE CROSS/BLUE SHIELD

## 2018-03-25 ENCOUNTER — Emergency Department
Admission: EM | Admit: 2018-03-25 | Discharge: 2018-03-26 | Disposition: A | Discharge status: Home / Self Care | Payer: BLUE CROSS/BLUE SHIELD | Attending: Emergency Medicine | Admitting: Emergency Medicine

## 2018-03-25 DIAGNOSIS — Z79899 Other long term (current) drug therapy: Secondary | ICD-10-CM | POA: Diagnosis not present

## 2018-03-25 DIAGNOSIS — R1012 Left upper quadrant pain: Secondary | ICD-10-CM | POA: Diagnosis present

## 2018-03-25 DIAGNOSIS — N83201 Unspecified ovarian cyst, right side: Secondary | ICD-10-CM | POA: Insufficient documentation

## 2018-03-25 DIAGNOSIS — R109 Unspecified abdominal pain: Secondary | ICD-10-CM

## 2018-03-25 LAB — COMPREHENSIVE METABOLIC PANEL
ALBUMIN: 4.3 g/dL (ref 3.5–5.0)
ALT: 19 U/L (ref 0–44)
AST: 21 U/L (ref 15–41)
Alkaline Phosphatase: 57 U/L (ref 38–126)
Anion gap: 7 (ref 5–15)
BUN: 14 mg/dL (ref 6–20)
CHLORIDE: 106 mmol/L (ref 98–111)
CO2: 26 mmol/L (ref 22–32)
CREATININE: 0.69 mg/dL (ref 0.44–1.00)
Calcium: 9.2 mg/dL (ref 8.9–10.3)
GFR calc Af Amer: >60 mL/min (ref >60)
GFR calc non Af Amer: >60 mL/min (ref >60)
GLUCOSE: 113 mg/dL — AB (ref 70–99)
POTASSIUM: 3.2 mmol/L — AB (ref 3.5–5.1)
SODIUM: 139 mmol/L (ref 135–145)
Total Bilirubin: 0.6 mg/dL (ref 0.3–1.2)
Total Protein: 7.4 g/dL (ref 6.5–8.1)

## 2018-03-25 LAB — POCT PREGNANCY, URINE: PREG TEST UR: NEGATIVE

## 2018-03-25 LAB — CBC
HEMATOCRIT: 37 % (ref 35.0–47.0)
Hemoglobin: 12.6 g/dL (ref 12.0–16.0)
MCH: 28.3 pg (ref 26.0–34.0)
MCHC: 34 g/dL (ref 32.0–36.0)
MCV: 83.3 fL (ref 80.0–100.0)
PLATELETS: 266 10*3/uL (ref 150–440)
RBC: 4.44 MIL/uL (ref 3.80–5.20)
RDW: 13.8 % (ref 11.5–14.5)
WBC: 11.3 10*3/uL — ABNORMAL HIGH (ref 3.6–11.0)

## 2018-03-25 LAB — URINALYSIS, COMPLETE (UACMP) WITH MICROSCOPIC
Bacteria, UA: NONE SEEN
Bilirubin Urine: NEGATIVE
GLUCOSE, UA: NEGATIVE mg/dL
Hgb urine dipstick: NEGATIVE
Ketones, ur: NEGATIVE mg/dL
Leukocytes, UA: NEGATIVE
Nitrite: NEGATIVE
PH: 5 (ref 5.0–8.0)
PROTEIN: NEGATIVE mg/dL
Specific Gravity, Urine: 1.025 (ref 1.005–1.030)

## 2018-03-25 LAB — TROPONIN I: Troponin I: <0.03 ng/mL (ref <0.03)

## 2018-03-25 LAB — LIPASE, BLOOD: LIPASE: 40 U/L (ref 11–51)

## 2018-03-25 MED ORDER — KETOROLAC TROMETHAMINE 30 MG/ML IJ SOLN
INTRAMUSCULAR | Status: AC
  Filled 2018-03-25: qty 1

## 2018-03-25 MED ORDER — ONDANSETRON HCL 4 MG/2ML IJ SOLN
4.0000 mg | Freq: Once | INTRAMUSCULAR | Status: AC
  Administered 2018-03-25: 4 mg via INTRAVENOUS

## 2018-03-25 MED ORDER — KETOROLAC TROMETHAMINE 30 MG/ML IJ SOLN
30.0000 mg | Freq: Once | INTRAMUSCULAR | Status: AC
  Administered 2018-03-25: 30 mg via INTRAVENOUS

## 2018-03-25 MED ORDER — TRAMADOL HCL 50 MG PO TABS: 50.0000 mg | ORAL_TABLET | Freq: Four times a day (QID) | ORAL | 0 refills | Status: DC | PRN

## 2018-03-25 MED ORDER — ONDANSETRON 4 MG PO TBDP: 4.0000 mg | ORAL_TABLET | Freq: Three times a day (TID) | ORAL | 0 refills | Status: DC | PRN

## 2018-03-25 MED ORDER — ONDANSETRON HCL 4 MG/2ML IJ SOLN
INTRAMUSCULAR | Status: AC
  Administered 2018-03-25: 4 mg via INTRAVENOUS
  Filled 2018-03-25: qty 2

## 2018-03-25 NOTE — ED Provider Notes (Signed)
[facility] Emergency Department Provider Note  Time seen: 10:39 PM  I have reviewed the triage vital signs and the nursing notes.   HISTORY  Chief Complaint Abdominal Pain and Back Pain    HPI Alexis [name] is a 32 y.o. female with a past medical history of anemia, IBS, presents to the emergency department with upper left-sided abdominal pain.  According to the patient since this morning she has been experiencing pain wrapping around her left side.  States nausea, but denies any vomiting, denies any diarrhea, no dysuria or hematuria.  Patient states she had a gallbladder removed greater than 10 years ago.  Denies any fever.  Describes the pain is intermittent and moderate, aching type pain.   Past Medical History:  Diagnosis Date  . Anemia   . Hypoglycemia   . Irritable bowel syndrome     There are no active problems to display for this patient.   Past Surgical History:  Procedure Laterality Date  . CHOLECYSTECTOMY    . TUBAL LIGATION      Prior to Admission medications   Medication Sig Start Date End Date Taking? Authorizing Provider  FLUoxetine (PROZAC) 10 MG capsule Take 10 mg by mouth daily.    [provider]  ibuprofen (ADVIL,MOTRIN) 800 MG tablet Take 1 tablet (800 mg total) by mouth every 8 (eight) hours as needed for moderate pain. 10/05/17   Joni ReiningSmith, Ronald K, PA-C  loperamide (IMODIUM A-D) 2 MG tablet Take 1 tablet (2 mg total) by mouth 4 (four) times daily as needed for diarrhea or loose stools. 11/15/17   Minna AntisPaduchowski, Taqwa Deem, MD  ondansetron (ZOFRAN ODT) 4 MG disintegrating tablet Take 1 tablet (4 mg total) by mouth every 8 (eight) hours as needed for nausea or vomiting. 11/15/17   Minna AntisPaduchowski, Allie Gerhold, MD  oxyCODONE-acetaminophen (PERCOCET/ROXICET) 5-325 MG tablet Take 1 tablet every 4 (four) hours as needed by mouth for severe pain. 07/16/17   Linzie CollinEvans, David James, MD  promethazine-dextromethorphan (PROMETHAZINE-DM) 6.25-15 MG/5ML  syrup Take 5 mLs by mouth 4 (four) times daily as needed for cough. 10/05/17   Joni ReiningSmith, Ronald K, PA-C  traZODone (DESYREL) 50 MG tablet Take 50 mg by mouth at bedtime as needed for sleep.    [provider]    Allergies  Allergen Reactions  . Morphine And Related Swelling    Family History  Problem Relation Age of Onset  . Diabetes Father   . Cancer Maternal Aunt   . Cancer Paternal Aunt   . Breast cancer Maternal Grandmother   . Diabetes Paternal Grandfather     Social History Social History   Tobacco Use  . Smoking status: Never Smoker  . Smokeless tobacco: Never Used  Substance Use Topics  . Alcohol use: Yes    Comment: rarely  . Drug use: No    Review of Systems Constitutional: Negative for fever. Cardiovascular: Negative for chest pain. Respiratory: Negative for shortness of breath. Gastrointestinal: Left-sided abdominal pain, negative for vomiting or diarrhea Genitourinary: Negative for urinary compaints Musculoskeletal: Negative for musculoskeletal complaints Skin: Negative for skin complaints  Neurological: Negative for headache All other ROS negative  ____________________________________________   PHYSICAL EXAM:  VITAL SIGNS: ED Triage Vitals [03/25/18 2108]  Enc Vitals Group     BP 105/70     Pulse Rate 86     Resp 20     Temp 98.5 F (36.9 C)     Temp Source Oral     SpO2 97 %  Weight 215 lb (97.5 kg)     Height 5\' 9"  (1.753 m)     Head Circumference      Peak Flow      Pain Score 6     Pain Loc      Pain Edu?      Excl. in GC?     Constitutional: Alert and oriented. Well appearing and in no distress. Eyes: Normal exam ENT   Head: Normocephalic and atraumatic.   Mouth/Throat: Mucous membranes are moist. Cardiovascular: Normal rate, regular rhythm. No murmur Respiratory: Normal respiratory effort without tachypnea nor retractions. Breath sounds are clear  Gastrointestinal: Soft, mild left upper quadrant tenderness  palpation, no rebound or guarding, no distention. Musculoskeletal: Nontender with normal range of motion in all extremities.  Neurologic:  Normal speech and language. No gross focal neurologic deficits  Skin:  Skin is warm, dry and intact.  Psychiatric: Mood and affect are normal.  ____________________________________________     RADIOLOGY  CT shows right-sided ovarian cyst, otherwise negative possible recently passed stone.  ____________________________________________   INITIAL IMPRESSION / ASSESSMENT AND PLAN / ED COURSE  Pertinent labs & imaging results that were available during my care of the patient were reviewed by me and considered in my medical decision making (see chart for details).  Patient presents emergency department with left upper quadrant and left-sided abdominal pain.  Differential would include gastritis, gastroenteritis, gastric or peptic ulcers, colitis or diverticulitis, urinary tract infection, pyelonephritis, ureterolithiasis.  Labs are largely within normal limits.  CT scan shows possible recently passed stone, right ovarian follicle appears to be unrelated.  We will discharge patient home with a short course of pain and nausea medication, have the patient follow-up with her doctor.  I discussed abdominal pain return precautions.  Patient agreeable to plan of care.  ____________________________________________   FINAL CLINICAL IMPRESSION(S) / ED DIAGNOSES  Abdominal pain    Minna Antis, MD 03/25/18 2241

## 2018-03-25 NOTE — ED Triage Notes (Addendum)
Pt presents to ED with sharp epigastric pain that radiates around below her ribs to the middle of her back. Lying down and taking a deep breath seems to make pain increase. Symptoms have gotten progressively worse. +nausea; denies vomiting. Gallbladder removed >10 years ago.

## 2018-03-25 NOTE — ED Notes (Signed)
Patient transported to CT at this time. 

## 2018-04-01 ENCOUNTER — Emergency Department
Admission: EM | Admit: 2018-04-01 | Discharge: 2018-04-01 | Disposition: A | Discharge status: Home / Self Care | Payer: BLUE CROSS/BLUE SHIELD | Attending: Emergency Medicine | Admitting: Emergency Medicine

## 2018-04-01 ENCOUNTER — Other Ambulatory Visit: Payer: Self-pay

## 2018-04-01 DIAGNOSIS — Z79899 Other long term (current) drug therapy: Secondary | ICD-10-CM | POA: Diagnosis not present

## 2018-04-01 DIAGNOSIS — N390 Urinary tract infection, site not specified: Secondary | ICD-10-CM

## 2018-04-01 DIAGNOSIS — R102 Pelvic and perineal pain: Secondary | ICD-10-CM | POA: Diagnosis not present

## 2018-04-01 DIAGNOSIS — N83201 Unspecified ovarian cyst, right side: Secondary | ICD-10-CM

## 2018-04-01 DIAGNOSIS — R1030 Lower abdominal pain, unspecified: Secondary | ICD-10-CM

## 2018-04-01 LAB — BASIC METABOLIC PANEL
ANION GAP: 3 — AB (ref 5–15)
BUN: 9 mg/dL (ref 6–20)
CALCIUM: 9.2 mg/dL (ref 8.9–10.3)
CO2: 31 mmol/L (ref 22–32)
Chloride: 106 mmol/L (ref 98–111)
Creatinine, Ser: 0.7 mg/dL (ref 0.44–1.00)
GFR calc Af Amer: >60 mL/min (ref >60)
Glucose, Bld: 99 mg/dL (ref 70–99)
POTASSIUM: 3.9 mmol/L (ref 3.5–5.1)
Sodium: 140 mmol/L (ref 135–145)

## 2018-04-01 LAB — URINALYSIS, COMPLETE (UACMP) WITH MICROSCOPIC
Bilirubin Urine: NEGATIVE
GLUCOSE, UA: NEGATIVE mg/dL
Hgb urine dipstick: NEGATIVE
KETONES UR: NEGATIVE mg/dL
Nitrite: NEGATIVE
PH: 6 (ref 5.0–8.0)
Protein, ur: NEGATIVE mg/dL
Specific Gravity, Urine: 1.015 (ref 1.005–1.030)

## 2018-04-01 LAB — CBC
HEMATOCRIT: 35.5 % (ref 35.0–47.0)
HEMOGLOBIN: 12.1 g/dL (ref 12.0–16.0)
MCH: 28.4 pg (ref 26.0–34.0)
MCHC: 34.1 g/dL (ref 32.0–36.0)
MCV: 83.1 fL (ref 80.0–100.0)
Platelets: 248 10*3/uL (ref 150–440)
RBC: 4.27 MIL/uL (ref 3.80–5.20)
RDW: 13.9 % (ref 11.5–14.5)
WBC: 9.7 10*3/uL (ref 3.6–11.0)

## 2018-04-01 LAB — POCT PREGNANCY, URINE: PREG TEST UR: NEGATIVE

## 2018-04-01 MED ORDER — KETOROLAC TROMETHAMINE 30 MG/ML IJ SOLN
60.0000 mg | Freq: Once | INTRAMUSCULAR | Status: AC
  Administered 2018-04-01: 60 mg via INTRAMUSCULAR
  Filled 2018-04-01: qty 2

## 2018-04-01 MED ORDER — HYDROCODONE-ACETAMINOPHEN 5-325 MG PO TABS
1.0000 | ORAL_TABLET | Freq: Once | ORAL | Status: AC
  Administered 2018-04-01: 1 via ORAL
  Filled 2018-04-01: qty 1

## 2018-04-01 MED ORDER — ONDANSETRON 4 MG PO TBDP: 4.0000 mg | ORAL_TABLET | Freq: Three times a day (TID) | ORAL | 0 refills | Status: DC | PRN

## 2018-04-01 MED ORDER — CEPHALEXIN 500 MG PO CAPS: 500.0000 mg | ORAL_CAPSULE | Freq: Three times a day (TID) | ORAL | 0 refills | Status: DC

## 2018-04-01 MED ORDER — CEPHALEXIN 500 MG PO CAPS
500.0000 mg | ORAL_CAPSULE | Freq: Once | ORAL | Status: AC
  Administered 2018-04-01: 500 mg via ORAL
  Filled 2018-04-01: qty 1

## 2018-04-01 MED ORDER — HYDROCODONE-ACETAMINOPHEN 5-325 MG PO TABS: 1.0000 | ORAL_TABLET | Freq: Four times a day (QID) | ORAL | 0 refills | Status: DC | PRN

## 2018-04-01 MED ORDER — IBUPROFEN 800 MG PO TABS: 800.0000 mg | ORAL_TABLET | Freq: Three times a day (TID) | ORAL | 0 refills | Status: DC | PRN

## 2018-04-01 NOTE — ED Triage Notes (Signed)
Pt arrives to ED via POV from home with c/o right-sided flank pain with radiation into the RLQ and LLQ. Pt was seen here a week ago for same and d/x'd with kidney stones and ovarian cysts. Pt has not followed up with her PCP; she states she is here for pain control d/t running out of r/x'd pain and nausea medications.

## 2018-04-01 NOTE — ED Notes (Signed)

## 2018-04-01 NOTE — Discharge Instructions (Signed)
1.  Take antibiotic as prescribed (Keflex 500 mg 3 times daily x7 days). 2.  You may take pain and nausea medicines as needed (Motrin/Norco/Zofran). 3.  Please make an appointment for follow-up with the specialist or your PCP.   4.  Return to the ER for worsening symptoms, persistent vomiting, difficulty breathing or other concerns.

## 2018-04-01 NOTE — ED Provider Notes (Signed)
[facility] Emergency Department Provider Note   ____________________________________________   First MD Initiated Contact with Patient 04/01/18 [phone]     (approximate)  I have reviewed the triage vital signs and the nursing notes.   HISTORY  Chief Complaint Abdominal Pain and Flank Pain    HPI Alexis [name] is a 32 y.o. female who returns to the ED from home with a chief complaint of pain control.  Patient was seen in the ED 1 week ago and diagnosed with recently passed ureteral stone and right ovarian cyst.  She was discharged home on tramadol.  Patient admits she did not follow-up with her PCP.  Now has run out of her pain and nausea medicines.  Complains of right flank and pelvic pain.  Denies fever, chills, chest pain, shortness of breath, vomiting, dysuria, diarrhea.  Denies recent travel or trauma.   Past Medical History:  Diagnosis Date  . Anemia   . Hypoglycemia   . Irritable bowel syndrome     There are no active problems to display for this patient.   Past Surgical History:  Procedure Laterality Date  . CHOLECYSTECTOMY    . TUBAL LIGATION      Prior to Admission medications   Medication Sig Start Date End Date Taking? Authorizing Provider  cephALEXin (KEFLEX) 500 MG capsule Take 1 capsule (500 mg total) by mouth 3 (three) times daily. 04/01/18   Irean Hong, MD  FLUoxetine (PROZAC) 10 MG capsule Take 10 mg by mouth daily.    [provider]  HYDROcodone-acetaminophen (NORCO) 5-325 MG tablet Take 1 tablet by mouth every 6 (six) hours as needed for moderate pain. 04/01/18   Irean Hong, MD  ibuprofen (ADVIL,MOTRIN) 800 MG tablet Take 1 tablet (800 mg total) by mouth every 8 (eight) hours as needed for moderate pain. 04/01/18   Irean Hong, MD  loperamide (IMODIUM A-D) 2 MG tablet Take 1 tablet (2 mg total) by mouth 4 (four) times daily as needed for diarrhea or loose stools. 11/15/17   Minna Antis, MD  ondansetron  (ZOFRAN ODT) 4 MG disintegrating tablet Take 1 tablet (4 mg total) by mouth every 8 (eight) hours as needed for nausea or vomiting. 04/01/18   Irean Hong, MD  promethazine-dextromethorphan (PROMETHAZINE-DM) 6.25-15 MG/5ML syrup Take 5 mLs by mouth 4 (four) times daily as needed for cough. 10/05/17   Joni Reining, PA-C  traZODone (DESYREL) 50 MG tablet Take 50 mg by mouth at bedtime as needed for sleep.    [provider]    Allergies Morphine and related  Family History  Problem Relation Age of Onset  . Diabetes Father   . Cancer Maternal Aunt   . Cancer Paternal Aunt   . Breast cancer Maternal Grandmother   . Diabetes Paternal Grandfather     Social History Social History   Tobacco Use  . Smoking status: Never Smoker  . Smokeless tobacco: Never Used  Substance Use Topics  . Alcohol use: Yes    Comment: rarely  . Drug use: No    Review of Systems  Constitutional: No fever/chills Eyes: No visual changes. ENT: No sore throat. Cardiovascular: Denies chest pain. Respiratory: Denies shortness of breath. Gastrointestinal: Positive for pelvic pain.  No abdominal pain.  Positive for nausea, no vomiting.  No diarrhea.  No constipation. Genitourinary: Negative for dysuria. Musculoskeletal: Negative for back pain. Skin: Negative for rash. Neurological: Negative for headaches, focal weakness or numbness.   ____________________________________________  PHYSICAL EXAM:  VITAL SIGNS: ED Triage Vitals  Enc Vitals Group     BP 04/01/18 0054 (!) 125/58     Pulse Rate 04/01/18 0054 62     Resp 04/01/18 0054 17     Temp 04/01/18 0054 98.2 F (36.8 C)     Temp Source 04/01/18 0054 Oral     SpO2 04/01/18 0054 99 %     Weight 04/01/18 0055 215 lb (97.5 kg)     Height 04/01/18 0055 5\' 1"  (1.549 m)     Head Circumference --      Peak Flow --      Pain Score 04/01/18 0054 6     Pain Loc --      Pain Edu? --      Excl. in GC? --     Constitutional: Asleep,  awakened for exam.  Alert and oriented. Well appearing and in no acute distress. Eyes: Conjunctivae are normal. PERRL. EOMI. Head: Atraumatic. Nose: No congestion/rhinnorhea. Mouth/Throat: Mucous membranes are moist.  Oropharynx non-erythematous. Neck: No stridor.   Cardiovascular: Normal rate, regular rhythm. Grossly normal heart sounds.  Good peripheral circulation. Respiratory: Normal respiratory effort.  No retractions. Lungs CTAB. Gastrointestinal: Soft and minimally tender to palpation right pelvis without rebound or guarding. No distention. No abdominal bruits. No CVA tenderness. Musculoskeletal: No lower extremity tenderness nor edema.  No joint effusions. Neurologic:  Normal speech and language. No gross focal neurologic deficits are appreciated. No gait instability. Skin:  Skin is warm, dry and intact. No rash noted. Psychiatric: Mood and affect are normal. Speech and behavior are normal.  ____________________________________________   LABS (all labs ordered are listed, but only abnormal results are displayed)  Labs Reviewed  URINALYSIS, COMPLETE (UACMP) WITH MICROSCOPIC - Abnormal; Notable for the following components:      Result Value   Color, Urine YELLOW (*)    APPearance HAZY (*)    Leukocytes, UA SMALL (*)    Bacteria, UA RARE (*)    All other components within normal limits  BASIC METABOLIC PANEL - Abnormal; Notable for the following components:   Anion gap 3 (*)    All other components within normal limits  CBC  POC URINE PREG, ED  POCT PREGNANCY, URINE   ____________________________________________  EKG  None ____________________________________________  RADIOLOGY  ED MD interpretation: None  Official radiology report(s): No results found.  ____________________________________________   PROCEDURES  Procedure(s) performed:   Pelvic deferred by patient  Procedures  Critical Care performed:  No  ____________________________________________   INITIAL IMPRESSION / ASSESSMENT AND PLAN / ED COURSE  As part of my medical decision making, I reviewed the following data within the electronic MEDICAL RECORD NUMBER Nursing notes reviewed and incorporated, Labs reviewed, Old chart reviewed and Notes from prior ED visits   32 year old female who returns to the ED for medicine refill.  Recently diagnosed with right ovarian cyst and recently passed ureteral stone.  Laboratory results unremarkable.  Now with mild UTI.  Emphasized importance of follow-up with OB/GYN as the ED cannot continue to refill her medications.  Patient asked for referral to any clinic other than [facility].  Will discharge home with prescription for Keflex, limited quantity Norco and Zofran.  Strict return precautions given.  Patient verbalizes understanding and agrees with plan of care.      ____________________________________________   FINAL CLINICAL IMPRESSION(S) / ED DIAGNOSES  Final diagnoses:  Lower abdominal pain  Cyst of right ovary  Lower urinary tract infectious disease  ED Discharge Orders        Ordered    cephALEXin (KEFLEX) 500 MG capsule  3 times daily     04/01/18 0351    ibuprofen (ADVIL,MOTRIN) 800 MG tablet  Every 8 hours PRN     04/01/18 0351    ondansetron (ZOFRAN ODT) 4 MG disintegrating tablet  Every 8 hours PRN     04/01/18 0351    HYDROcodone-acetaminophen (NORCO) 5-325 MG tablet  Every 6 hours PRN     04/01/18 0351       Note:  This document was prepared using Dragon voice recognition software and may include unintentional dictation errors.    Irean HongSung, Jamis Kryder J, MD 04/01/18 [phone]

## 2018-08-10 ENCOUNTER — Emergency Department
Admission: EM | Admit: 2018-08-10 | Discharge: 2018-08-10 | Disposition: A | Discharge status: Home / Self Care | Payer: BLUE CROSS/BLUE SHIELD | Attending: Emergency Medicine | Admitting: Emergency Medicine

## 2018-08-10 ENCOUNTER — Encounter: Payer: Self-pay | Admitting: Emergency Medicine

## 2018-08-10 ENCOUNTER — Other Ambulatory Visit: Payer: Self-pay

## 2018-08-10 DIAGNOSIS — Z79899 Other long term (current) drug therapy: Secondary | ICD-10-CM | POA: Insufficient documentation

## 2018-08-10 DIAGNOSIS — R109 Unspecified abdominal pain: Secondary | ICD-10-CM | POA: Insufficient documentation

## 2018-08-10 DIAGNOSIS — R1032 Left lower quadrant pain: Secondary | ICD-10-CM | POA: Diagnosis present

## 2018-08-10 HISTORY — DX: Calculus of kidney: N20.0

## 2018-08-10 LAB — CBC
HCT: 41.3 % (ref 36.0–46.0)
HEMOGLOBIN: 13.4 g/dL (ref 12.0–15.0)
MCH: 27.8 pg (ref 26.0–34.0)
MCHC: 32.4 g/dL (ref 30.0–36.0)
MCV: 85.7 fL (ref 80.0–100.0)
NRBC: 0 % (ref 0.0–0.2)
Platelets: 318 10*3/uL (ref 150–400)
RBC: 4.82 MIL/uL (ref 3.87–5.11)
RDW: 12.8 % (ref 11.5–15.5)
WBC: 7.4 10*3/uL (ref 4.0–10.5)

## 2018-08-10 LAB — URINALYSIS, COMPLETE (UACMP) WITH MICROSCOPIC
Bacteria, UA: NONE SEEN
Bilirubin Urine: NEGATIVE
GLUCOSE, UA: NEGATIVE mg/dL
HGB URINE DIPSTICK: NEGATIVE
Ketones, ur: NEGATIVE mg/dL
NITRITE: NEGATIVE
PROTEIN: NEGATIVE mg/dL
Specific Gravity, Urine: 1.025 (ref 1.005–1.030)
pH: 5 (ref 5.0–8.0)

## 2018-08-10 LAB — BASIC METABOLIC PANEL
ANION GAP: 6 (ref 5–15)
BUN: 15 mg/dL (ref 6–20)
CHLORIDE: 106 mmol/L (ref 98–111)
CO2: 28 mmol/L (ref 22–32)
Calcium: 9.5 mg/dL (ref 8.9–10.3)
Creatinine, Ser: 0.74 mg/dL (ref 0.44–1.00)
GFR calc Af Amer: >60 mL/min (ref >60)
GFR calc non Af Amer: >60 mL/min (ref >60)
Glucose, Bld: 97 mg/dL (ref 70–99)
POTASSIUM: 4.1 mmol/L (ref 3.5–5.1)
Sodium: 140 mmol/L (ref 135–145)

## 2018-08-10 LAB — POCT PREGNANCY, URINE: Preg Test, Ur: NEGATIVE

## 2018-08-10 MED ORDER — DICYCLOMINE HCL 10 MG PO CAPS
20.0000 mg | ORAL_CAPSULE | Freq: Once | ORAL | Status: AC
  Administered 2018-08-10: 20 mg via ORAL
  Filled 2018-08-10: qty 2

## 2018-08-10 MED ORDER — MAGNESIUM CITRATE PO SOLN: 1.0000 | Freq: Once | ORAL | 0 refills | Status: AC

## 2018-08-10 MED ORDER — DICYCLOMINE HCL 20 MG PO TABS: 20.0000 mg | ORAL_TABLET | Freq: Three times a day (TID) | ORAL | 0 refills | Status: DC | PRN

## 2018-08-10 MED ORDER — POLYETHYLENE GLYCOL 3350 17 G PO PACK: 17.0000 g | PACK | Freq: Every day | ORAL | 0 refills | Status: DC

## 2018-08-10 NOTE — Discharge Instructions (Signed)
Please seek medical attention for any high fevers, chest pain, shortness of breath, change in behavior, persistent vomiting, bloody stool or any other new or concerning symptoms.  

## 2018-08-10 NOTE — ED Provider Notes (Signed)
[facility] Emergency Department Provider Note  _________________________________________   I have reviewed the triage vital signs and the nursing notes.   HISTORY  Chief Complaint Flank Pain   History limited by: Not Limited   HPI Alexis [name] is a 32 y.o. female who presents to the emergency department today with concern for left flank pain. The patient states that the pain started two days ago. It is located in the left flank and back and radiates around to her stomach. The patient states that she feels like she has had some chills but no fevers. She has some increased frequency of urination and states she feels a pressure sensation when she does urinate. Denies any bad odor to her urine. States that she was [facility] and treated for pyelonephritis.   Per medical record review patient has a history of recent ER visit with CT abd/pel without concerning findings.   Past Medical History:  Diagnosis Date  . Anemia   . Hypoglycemia   . Irritable bowel syndrome   . Kidney stones     There are no active problems to display for this patient.   Past Surgical History:  Procedure Laterality Date  . CHOLECYSTECTOMY    . TUBAL LIGATION      Prior to Admission medications   Medication Sig Start Date End Date Taking? Authorizing Provider  cephALEXin (KEFLEX) 500 MG capsule Take 1 capsule (500 mg total) by mouth 3 (three) times daily. 04/01/18   Irean HongSung, Jade J, MD  FLUoxetine (PROZAC) 10 MG capsule Take 10 mg by mouth daily.    [provider]  HYDROcodone-acetaminophen (NORCO) 5-325 MG tablet Take 1 tablet by mouth every 6 (six) hours as needed for moderate pain. 04/01/18   Irean HongSung, Jade J, MD  ibuprofen (ADVIL,MOTRIN) 800 MG tablet Take 1 tablet (800 mg total) by mouth every 8 (eight) hours as needed for moderate pain. 04/01/18   Irean HongSung, Jade J, MD  loperamide (IMODIUM A-D) 2 MG tablet Take 1 tablet (2 mg total) by mouth 4 (four)  times daily as needed for diarrhea or loose stools. 11/15/17   Minna AntisPaduchowski, Kevin, MD  ondansetron (ZOFRAN ODT) 4 MG disintegrating tablet Take 1 tablet (4 mg total) by mouth every 8 (eight) hours as needed for nausea or vomiting. 04/01/18   Irean HongSung, Jade J, MD  promethazine-dextromethorphan (PROMETHAZINE-DM) 6.25-15 MG/5ML syrup Take 5 mLs by mouth 4 (four) times daily as needed for cough. 10/05/17   Joni ReiningSmith, Ronald K, PA-C  traZODone (DESYREL) 50 MG tablet Take 50 mg by mouth at bedtime as needed for sleep.    [provider]    Allergies Morphine and related  Family History  Problem Relation Age of Onset  . Diabetes Father   . Cancer Maternal Aunt   . Cancer Paternal Aunt   . Breast cancer Maternal Grandmother   . Diabetes Paternal Grandfather     Social History Social History   Tobacco Use  . Smoking status: Never Smoker  . Smokeless tobacco: Never Used  Substance Use Topics  . Alcohol use: Yes    Comment: rarely  . Drug use: No    Review of Systems Constitutional: No fever/chills Eyes: No visual changes. ENT: No sore throat. Cardiovascular: Denies chest pain. Respiratory: Denies shortness of breath. Gastrointestinal: Positive for left abdominal pain.  Genitourinary: Negative for dysuria. Positive for increased frequency. Musculoskeletal: Positive for left lower back pain. Skin: Negative for rash. Neurological: Negative for headaches, focal weakness or  numbness.  ____________________________________________   PHYSICAL EXAM:  VITAL SIGNS: ED Triage Vitals  Enc Vitals Group     BP 08/10/18 1349 (!) 114/54     Pulse Rate 08/10/18 1349 75     Resp 08/10/18 1349 18     Temp 08/10/18 1349 98.6 F (37 C)     Temp Source 08/10/18 1349 Oral     SpO2 08/10/18 1349 97 %     Weight 08/10/18 1349 215 lb (97.5 kg)     Height 08/10/18 1349 5\' 9"  (1.753 m)     Head Circumference --      Peak Flow --      Pain Score 08/10/18 1351 5   Constitutional: Alert and  oriented.  Eyes: Conjunctivae are normal.  ENT      Head: Normocephalic and atraumatic.      Nose: No congestion/rhinnorhea.      Mouth/Throat: Mucous membranes are moist.      Neck: No stridor. Hematological/Lymphatic/Immunilogical: No cervical lymphadenopathy. Cardiovascular: Normal rate, regular rhythm.  No murmurs, rubs, or gallops.  Respiratory: Normal respiratory effort without tachypnea nor retractions. Breath sounds are clear and equal bilaterally. No wheezes/rales/rhonchi. Gastrointestinal: Soft and non tender. No rebound. No guarding.  Genitourinary: Deferred Musculoskeletal: Normal range of motion in all extremities. No lower extremity edema. Neurologic:  Normal speech and language. No gross focal neurologic deficits are appreciated.  Skin:  Skin is warm, dry and intact. No rash noted. Psychiatric: Mood and affect are normal. Speech and behavior are normal. Patient exhibits appropriate insight and judgment.  ____________________________________________    LABS (pertinent positives/negatives)  CBC wbc 7.4, hgb 13.4, plt 318 Upreg neg BMP wnl UA hazy, trace leukocytes, 0-5 RBC and WBC  ____________________________________________   EKG  None  ____________________________________________    RADIOLOGY  None  ____________________________________________   PROCEDURES  Procedures  ____________________________________________   INITIAL IMPRESSION / ASSESSMENT AND PLAN / ED COURSE  Pertinent labs & imaging results that were available during my care of the patient were reviewed by me and considered in my medical decision making (see chart for details).   Patient presented to the emergency department today because of concerns for left flank, back and abdominal pain.  Patient states she was recently treated for pyelonephritis.  On exam however patient without any significant CVA tenderness.  Abdomen is benign.  Blood work without leukocytosis and urine is not  convincing for urinary tract infection.  Did discuss with patient that I would send it out for urine culture.  At this point unclear etiology.  She states she did get some relief from the Bentyl.  At this point will discharge patient with prescription for Bentyl.  Would also consider musculoskeletal cause as a possibility of the pain.   ____________________________________________   FINAL CLINICAL IMPRESSION(S) / ED DIAGNOSES  Final diagnoses:  Left flank pain  Abdominal pain, unspecified abdominal location     Note: This dictation was prepared with Dragon dictation. Any transcriptional errors that result from this process are unintentional     Phineas Semen, MD 08/10/18 1845

## 2018-08-10 NOTE — ED Triage Notes (Signed)
Pt here for pain across lower back.  Finished abx for UTI within last week and felt better but now getting worse again.  Pain is also in lower stomach as well.  + urinary frequency.  No fever. VSS. Ambulatory.

## 2018-08-11 LAB — URINE CULTURE: Culture: NO GROWTH

## 2018-09-20 ENCOUNTER — Emergency Department
Admission: EM | Admit: 2018-09-20 | Discharge: 2018-09-20 | Disposition: A | Discharge status: Home / Self Care | Payer: Self-pay | Attending: Emergency Medicine | Admitting: Emergency Medicine

## 2018-09-20 ENCOUNTER — Emergency Department: Payer: Self-pay

## 2018-09-20 ENCOUNTER — Other Ambulatory Visit: Payer: Self-pay

## 2018-09-20 DIAGNOSIS — R0981 Nasal congestion: Secondary | ICD-10-CM | POA: Insufficient documentation

## 2018-09-20 DIAGNOSIS — J209 Acute bronchitis, unspecified: Secondary | ICD-10-CM | POA: Insufficient documentation

## 2018-09-20 DIAGNOSIS — R05 Cough: Secondary | ICD-10-CM | POA: Insufficient documentation

## 2018-09-20 DIAGNOSIS — R0982 Postnasal drip: Secondary | ICD-10-CM | POA: Insufficient documentation

## 2018-09-20 DIAGNOSIS — M7918 Myalgia, other site: Secondary | ICD-10-CM | POA: Insufficient documentation

## 2018-09-20 MED ORDER — IPRATROPIUM-ALBUTEROL 0.5-2.5 (3) MG/3ML IN SOLN
3.0000 mL | Freq: Once | RESPIRATORY_TRACT | Status: AC
  Administered 2018-09-20: 3 mL via RESPIRATORY_TRACT
  Filled 2018-09-20: qty 3

## 2018-09-20 MED ORDER — AZITHROMYCIN 250 MG PO TABS: ORAL_TABLET | ORAL | 0 refills | Status: DC

## 2018-09-20 NOTE — ED Triage Notes (Signed)
Sore throat and sinus congestion and drainage. Symptoms began new years eve.

## 2018-09-20 NOTE — ED Provider Notes (Signed)
[facility] Emergency Department Provider Note  ____________________________________________   First MD Initiated Contact with Patient 09/20/18 1557     (approximate)  I have reviewed the triage vital signs and the nursing notes.   HISTORY  Chief Complaint Cough and Nasal Congestion    HPI Alexis [name] is a 33 y.o. female presents emergency department complaining of flulike symptoms since New Year's Eve.  She states she has had a sore throat, sinus congestion, drainage, body aches, and cough.  She states now the mucus has turned colors and she is coughing up brown mucus.  She is worried that she has developed a pneumonia.  She denies chest pain or shortness of breath at this time.    Past Medical History:  Diagnosis Date  . Anemia   . Hypoglycemia   . Irritable bowel syndrome   . Kidney stones     There are no active problems to display for this patient.   Past Surgical History:  Procedure Laterality Date  . CHOLECYSTECTOMY    . TUBAL LIGATION      Prior to Admission medications   Medication Sig Start Date End Date Taking? Authorizing Provider  azithromycin (ZITHROMAX Z-PAK) 250 MG tablet 2 pills today then 1 pill a day for 4 days 09/20/18   Sherrie Mustache Roselyn Bering, PA-C    Allergies Morphine and related  Family History  Problem Relation Age of Onset  . Diabetes Father   . Cancer Maternal Aunt   . Cancer Paternal Aunt   . Breast cancer Maternal Grandmother   . Diabetes Paternal Grandfather     Social History Social History   Tobacco Use  . Smoking status: Never Smoker  . Smokeless tobacco: Never Used  Substance Use Topics  . Alcohol use: Yes    Comment: rarely  . Drug use: No    Review of Systems  Constitutional: Positive fever/chills Eyes: No visual changes. ENT: Positive sore throat. Respiratory: Positive cough Genitourinary: Negative for dysuria. Musculoskeletal: Negative for back pain. Skin: Negative for  rash.    ____________________________________________   PHYSICAL EXAM:  VITAL SIGNS: ED Triage Vitals [09/20/18 1454]  Enc Vitals Group     BP (!) 141/79     Pulse Rate 70     Resp 20     Temp 98.3 F (36.8 C)     Temp Source Oral     SpO2 96 %     Weight 210 lb (95.3 kg)     Height 5\' 8"  (1.727 m)     Head Circumference      Peak Flow      Pain Score 4     Pain Loc      Pain Edu?      Excl. in [place]?     Constitutional: Alert and oriented. Well appearing and in no acute distress. Eyes: Conjunctivae are normal.  Head: Atraumatic. Nose: No congestion/rhinnorhea. Mouth/Throat: Mucous membranes are moist.  Throat is irritated Neck:  supple no lymphadenopathy noted Cardiovascular: Normal rate, regular rhythm. Heart sounds are normal Respiratory: Normal respiratory effort.  No retractions, lungs c t a  GU: deferred Musculoskeletal: FROM all extremities, warm and well perfused Neurologic:  Normal speech and language.  Skin:  Skin is warm, dry and intact. No rash noted. Psychiatric: Mood and affect are normal. Speech and behavior are normal.  ____________________________________________   LABS (all labs ordered are listed, but only abnormal results are displayed)  Labs Reviewed - No data to display ____________________________________________  ____________________________________________  RADIOLOGY  Chest x-ray is negative for pneumonia per the radiologist, however there appears to be some infiltration noted in the right lower lung by myself  ____________________________________________   PROCEDURES  Procedure(s) performed: DuoNeb  Procedures    ____________________________________________   INITIAL IMPRESSION / ASSESSMENT AND PLAN / ED COURSE  Pertinent labs & imaging results that were available during my care of the patient were reviewed by me and considered in my medical decision making (see chart for details).   Patient is 33 year old female  presents emergency department with flulike symptoms since New Year's Eve.  She also started to cough up brown mucus in the last 2 days.  Physical exam shows a nontoxic patient.  Lungs with some diminished breath sounds.  Remainder the exam is unremarkable  Chest x-ray is negative for pneumonia per the radiologist.  However when I looked at the chest x-ray appears to be a small infiltrate noted in the right lower lung  DuoNeb was given.  Patient had increased air movement following the DuoNeb and states that she feels better.  Patient was given a prescription for Z-Pak due to the acute bronchitis.  Explained to her that I thought there might of been a questionable pneumonia but the radiologist was stating that the x-ray was negative.  Therefore she was going to be empirically treated for community-acquired pneumonia.  She states she understands and will comply.  She was given a prescription and discharged in stable condition.     As part of my medical decision making, I reviewed the following data within the electronic MEDICAL RECORD NUMBER Nursing notes reviewed and incorporated, Old chart reviewed, Radiograph reviewed chest x-ray has a questionable right lower lobe pneumonia, Notes from prior ED visits and [place] Controlled Substance Database  ____________________________________________   FINAL CLINICAL IMPRESSION(S) / ED DIAGNOSES  Final diagnoses:  Acute bronchitis, unspecified organism      NEW MEDICATIONS STARTED DURING THIS VISIT:  Discharge Medication List as of 09/20/2018  5:40 PM    START taking these medications   Details  azithromycin (ZITHROMAX Z-PAK) 250 MG tablet 2 pills today then 1 pill a day for 4 days, Normal         Note:  This document was prepared using Dragon voice recognition software and may include unintentional dictation errors.    Faythe GheeFisher,  W, PA-C 09/20/18 1751    Arnaldo NatalMalinda, Paul F, MD 09/20/18 [phone]

## 2018-09-20 NOTE — Discharge Instructions (Addendum)
Follow-up with your regular doctor if not better in 3 to 4 days.  Take medication as prescribed.  You take the pills for 5 days for the medication stays in your system for another 5.  Return if worsening.

## 2019-01-23 IMAGING — CT CT ABD-PELV W/ CM
2 of 4 series · 16 of 46 positions shown, 18 images · IV contrast (APPLIED)
Comparison: CT abdomen pelvis 01/12/2016.

CLINICAL DATA: Patient with lower abdominal and suprapubic pain.
Nausea.

EXAM:
CT ABDOMEN AND PELVIS WITH CONTRAST
TECHNIQUE: Multidetector CT imaging of the abdomen and pelvis was performed
using the standard protocol following bolus administration of
intravenous contrast.
CONTRAST:  100mL EERINC-BSS IOPAMIDOL (EERINC-BSS) INJECTION 61%

[Series 2: axial st · axial · 0.75mm/px · z∈[-592,-152]mm · 13 of 98 slices shown, 15 images]
[im 5/98  soft-tissue]
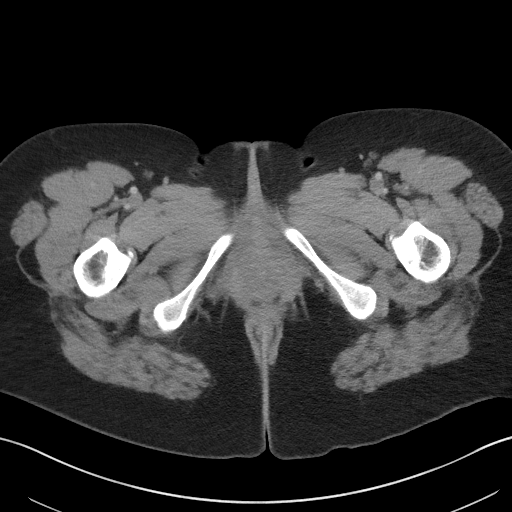
[im 5/98  bone]
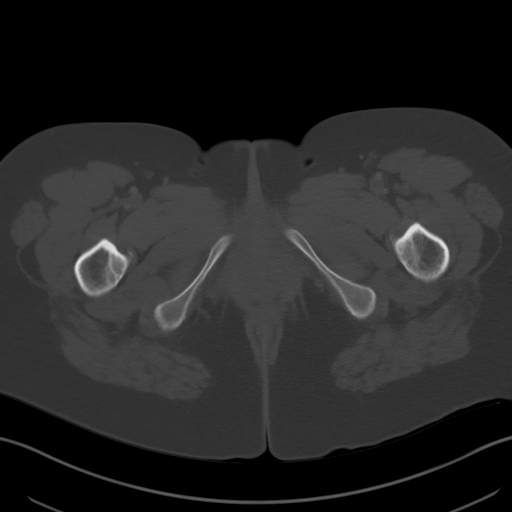
[im 13/98  soft-tissue]
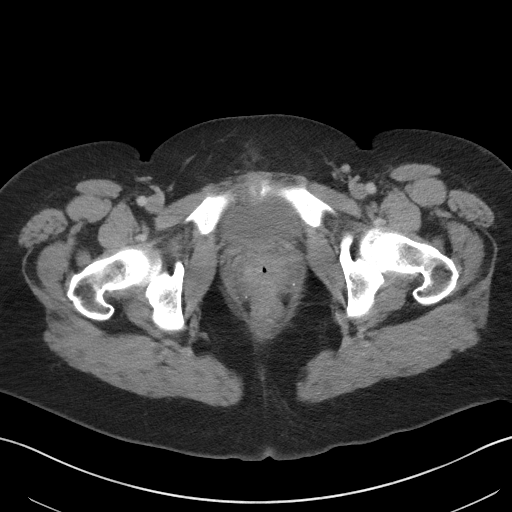
[im 21/98  soft-tissue]
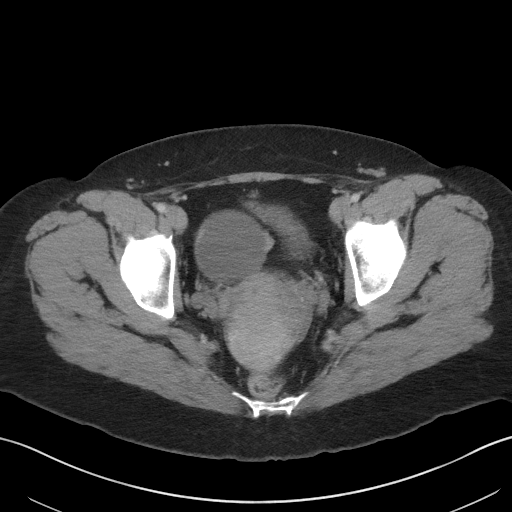
[im 29/98  soft-tissue]
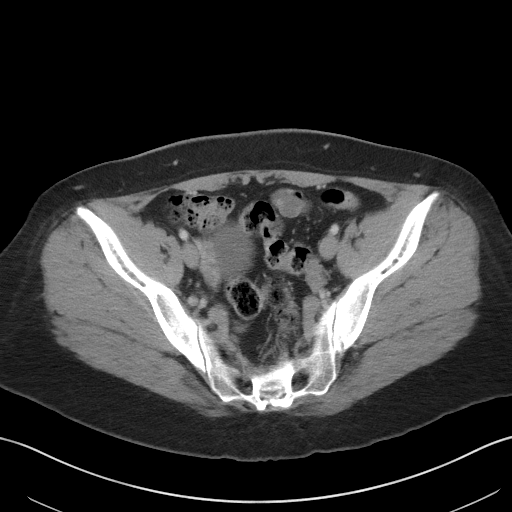
[im 33/98  soft-tissue]
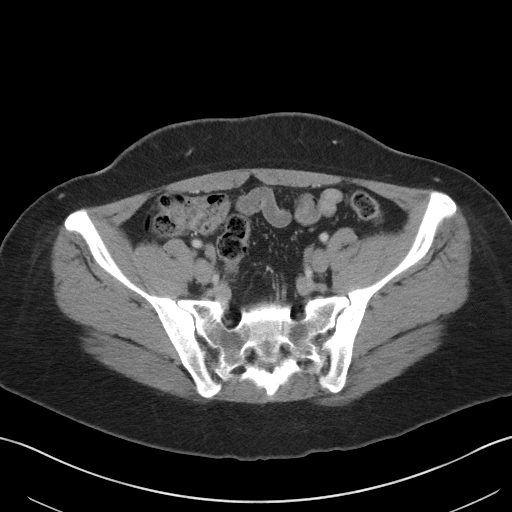
[im 41/98  soft-tissue]
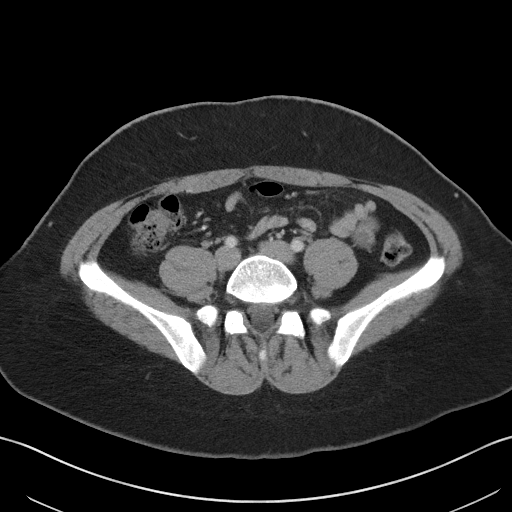
[im 49/98  soft-tissue]
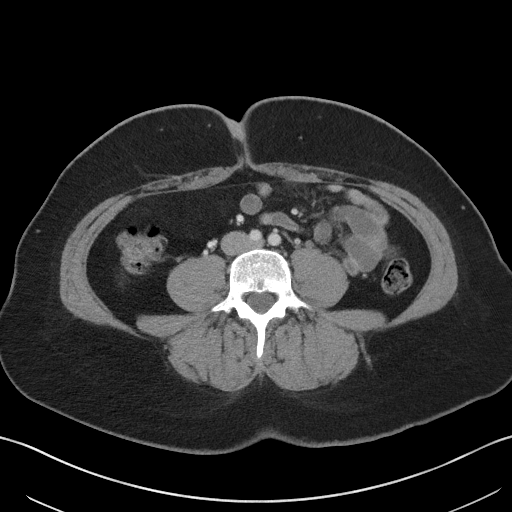
[im 57/98  soft-tissue]
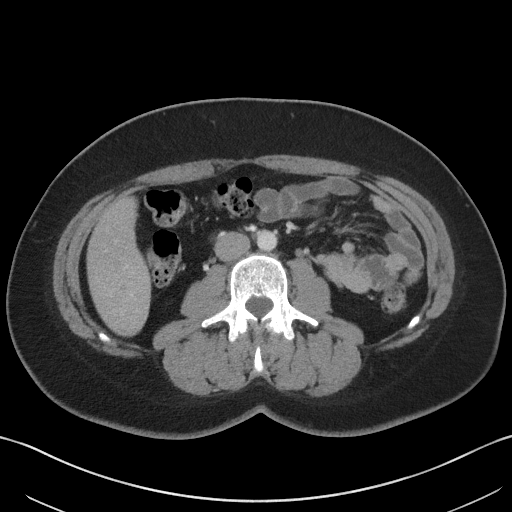
[im 65/98  soft-tissue]
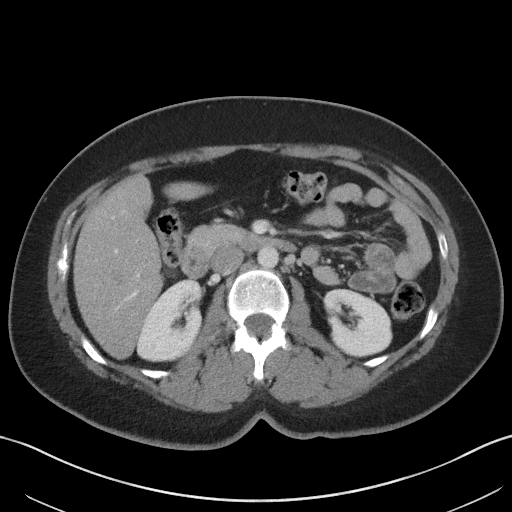
[im 65/98  bone]
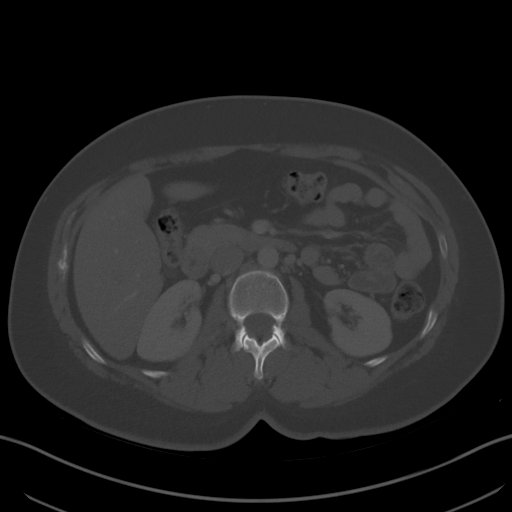
[im 69/98  soft-tissue]
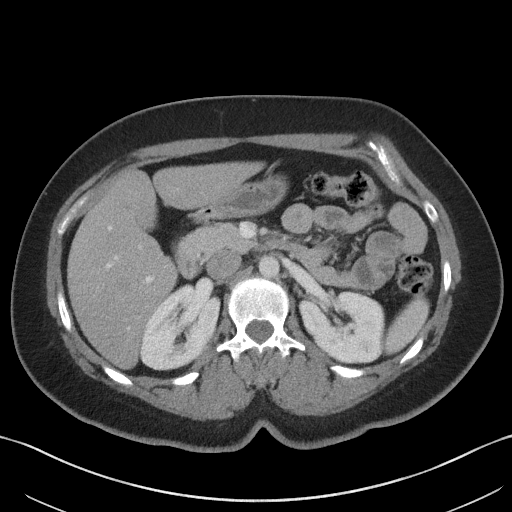
[im 77/98  soft-tissue]
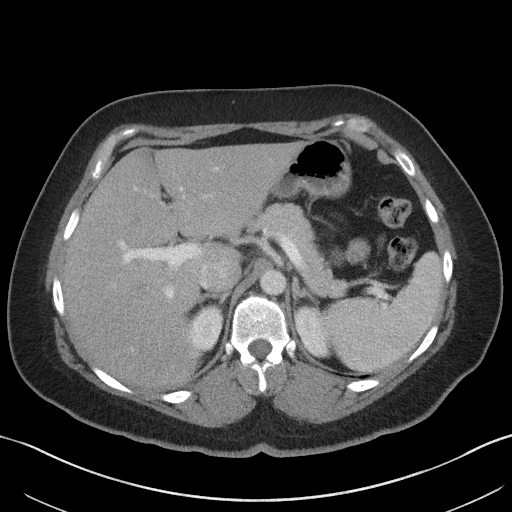
[im 85/98  soft-tissue]
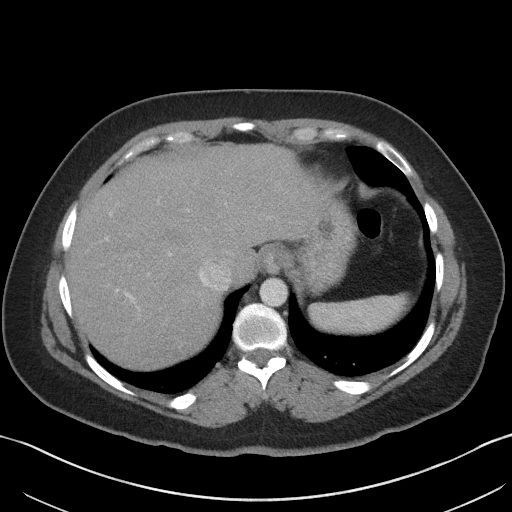
[im 93/98  soft-tissue]
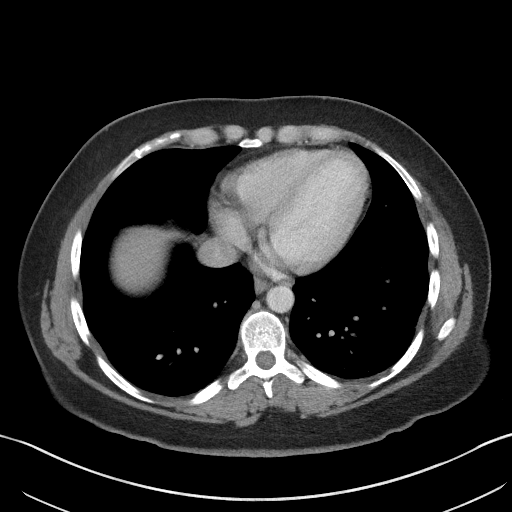

[Series 5: coronal st · coronal · 0.70mm/px · 3 of 84 slices shown]
[im 28/84  soft-tissue]
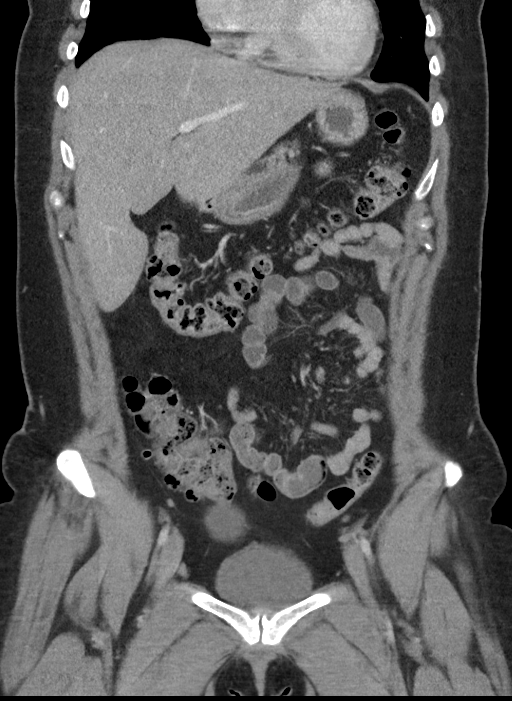
[im 37/84  soft-tissue]
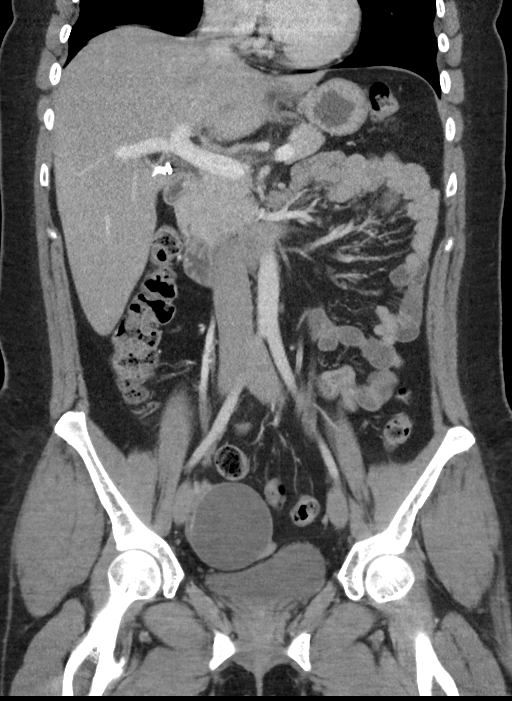
[im 47/84  soft-tissue]
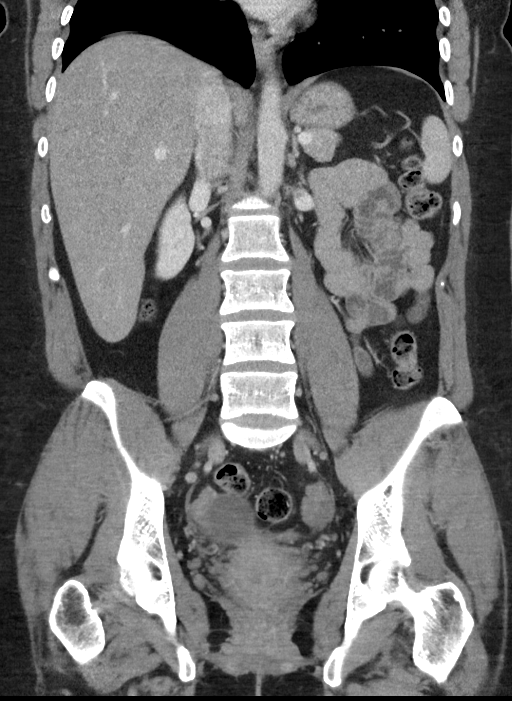

[16 of 46 positions shown; findings below may reference images not displayed]

FINDINGS: Lower chest: Normal heart size. Dependent atelectasis within the
bilateral lower lobes.

Hepatobiliary: Liver is normal in size and contour. No focal hepatic
lesions identified. Gallbladder is surgically absent.

Pancreas: Unremarkable

Spleen: Unremarkable

Adrenals/Urinary Tract: Normal adrenal glands. Kidneys enhance
symmetrically with contrast. No hydronephrosis. Urinary bladder is
unremarkable.

Stomach/Bowel: No abnormal bowel wall thickening or evidence for
bowel obstruction. Normal appendix. No free fluid or free
intraperitoneal air. Normal morphology of the stomach.

Vascular/Lymphatic: Normal caliber abdominal aorta. No
retroperitoneal lymphadenopathy.

Reproductive: Uterus is retroverted. Left adnexal structures are
unremarkable. Interval development of a 7.1 x 6.2 cm right adnexal
cystic structure (image 75; series 2).

Other: None.

Musculoskeletal: No aggressive or acute appearing osseous lesions.
IMPRESSION: 1. Large right ovarian cyst, better evaluated on same day pelvic
ultrasound.
2. Otherwise no acute process within the abdomen or pelvis.

## 2019-03-29 IMAGING — US US TRANSVAGINAL NON-OB
1 series · 13 of 25 positions shown · non-contrast
Comparison: 07/05/2017, CT 07/05/2017

CLINICAL DATA: Pelvic pain with nausea

EXAM:
TRANSVAGINAL ULTRASOUND OF PELVIS
DOPPLER ULTRASOUND OF OVARIES
TECHNIQUE: Transvaginal ultrasound examination of the pelvis was performed
including evaluation of the uterus, ovaries, adnexal regions, and
pelvic cul-de-sac.
Color and duplex Doppler ultrasound was utilized to evaluate blood
flow to the ovaries.

[Series 1: us transvaginal non-ob · 0.12mm/px · 13 of 53 slices shown]
[im 1/53]
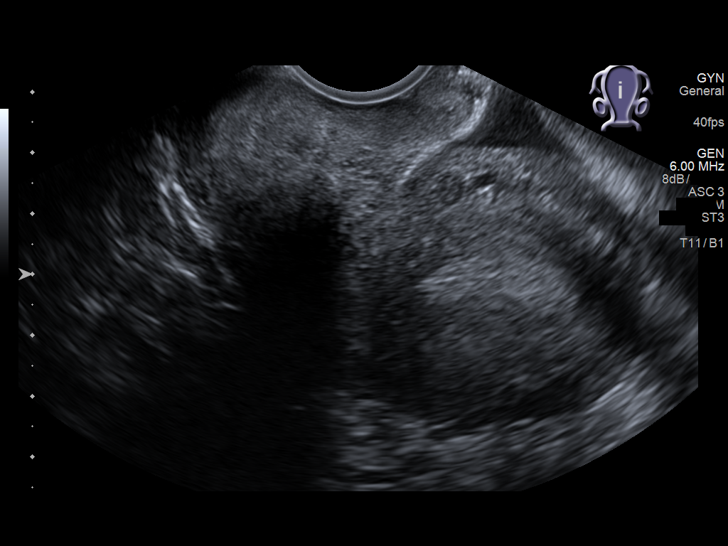
[im 5/53]
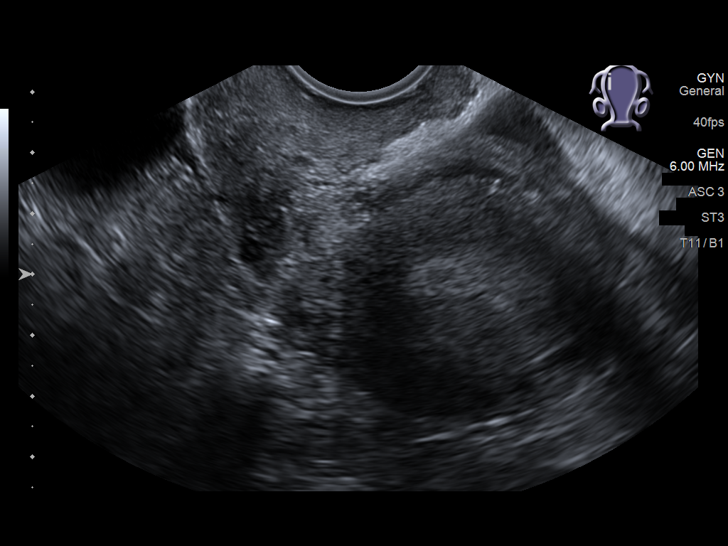
[im 9/53]
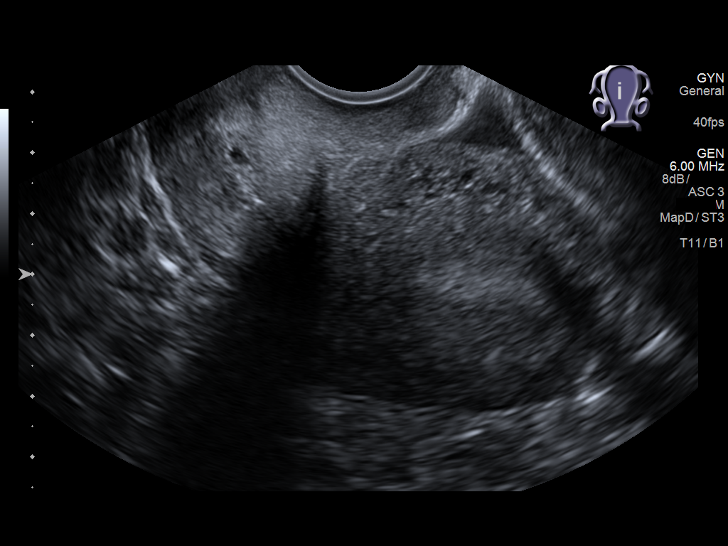
[im 14/53]
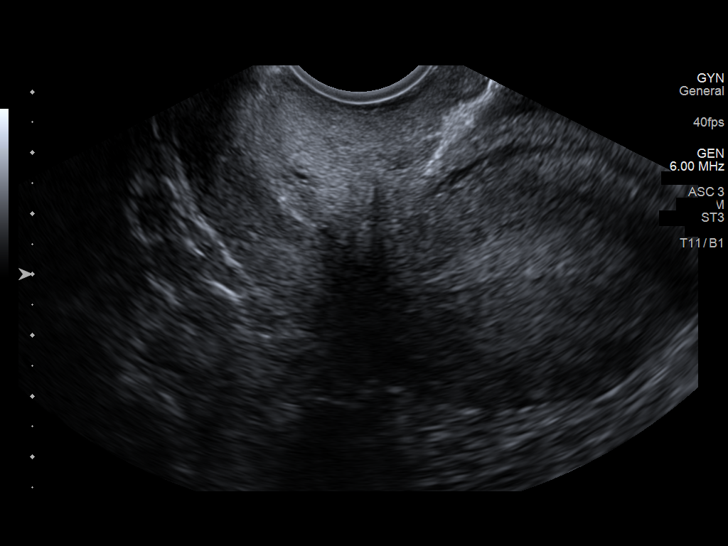
[im 18/53]
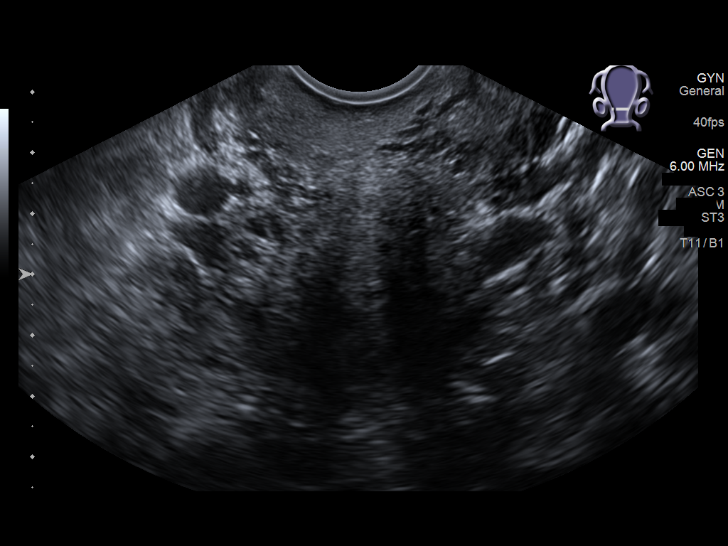
[im 22/53]
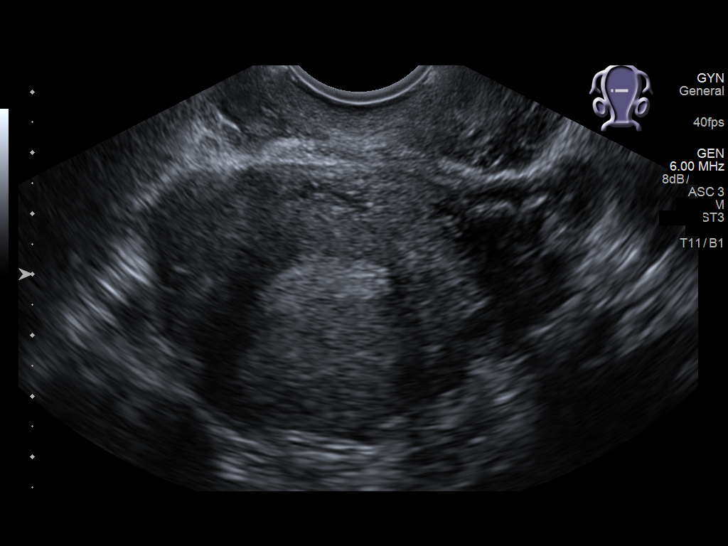
[im 27/53]
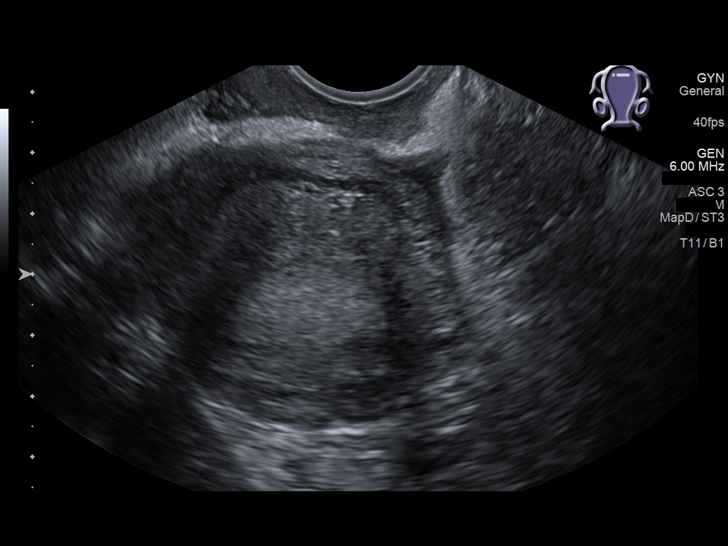
[im 31/53]
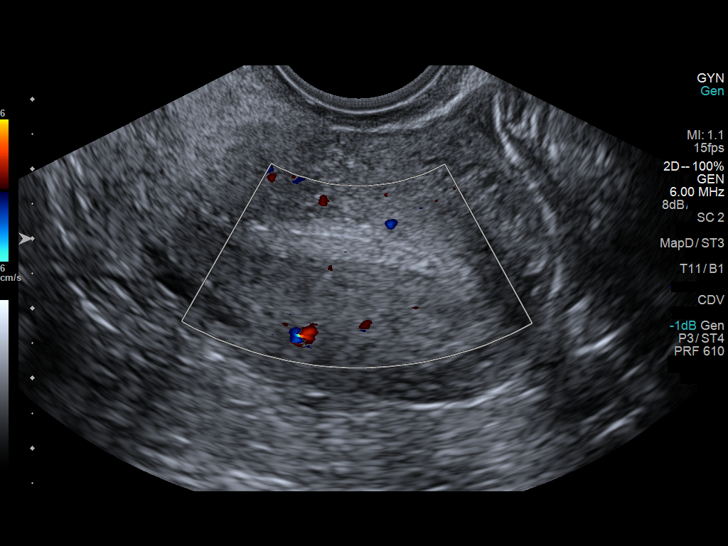
[im 35/53]
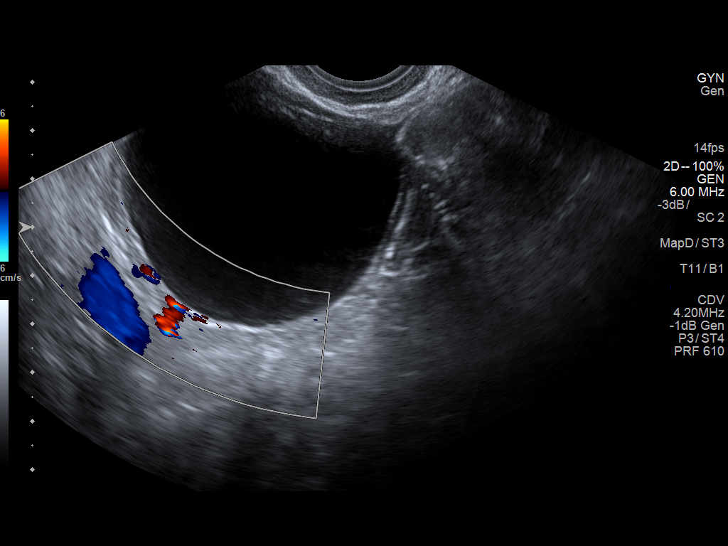
[im 40/53]
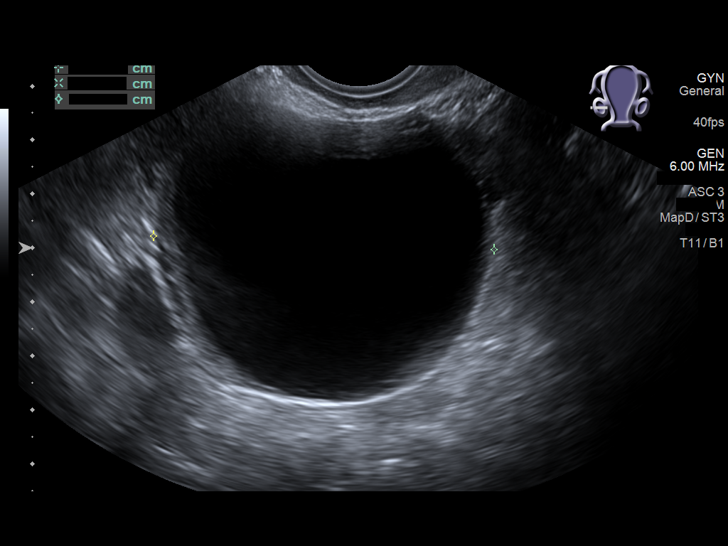
[im 44/53]
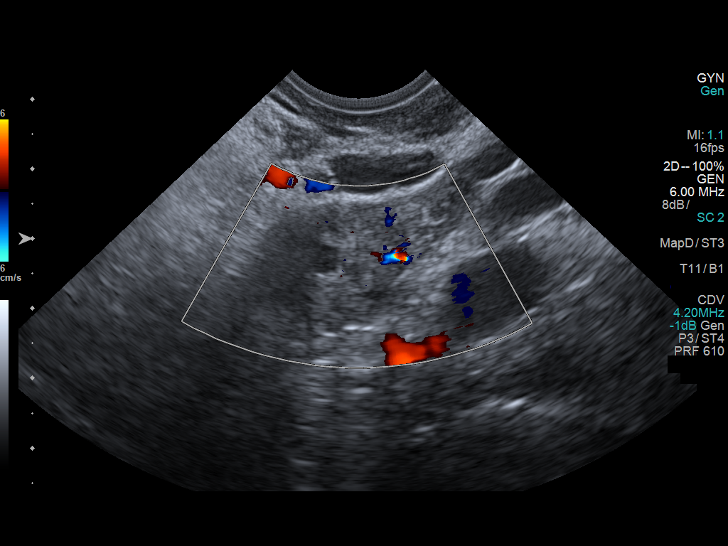
[im 48/53]
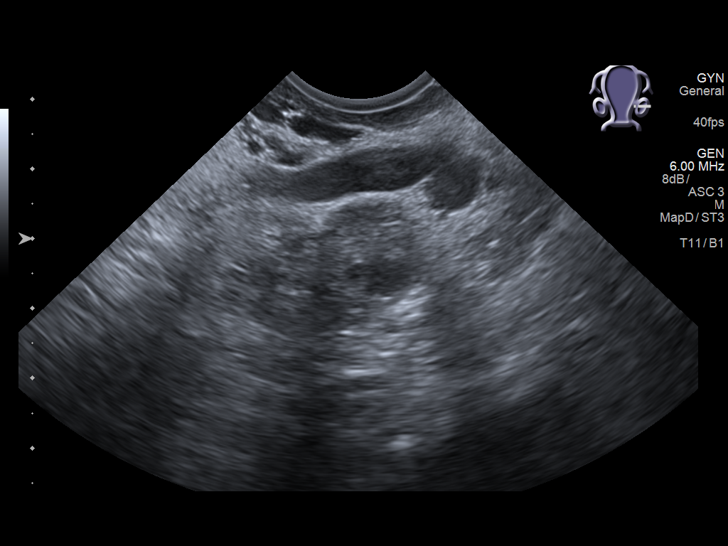
[im 53/53]
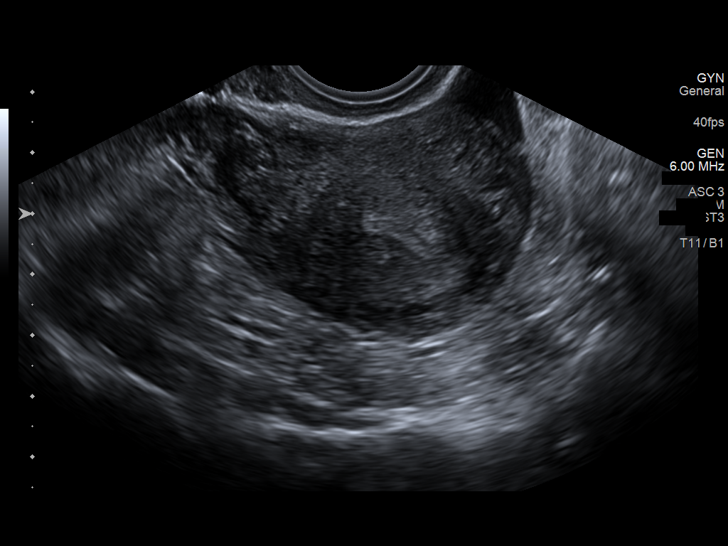

[13 of 25 positions shown; findings below may reference images not displayed]

FINDINGS: Uterus

Measurements: 8 x 4.5 x 5.3 cm. No fibroids or other mass
visualized.

Endometrium

Thickness: 5.9 mm.  No focal abnormality visualized.

Right ovary

Measurements: 7.3 x 5.6 x 6.3 cm. Large simple appearing cyst
measuring 6.8 x 5.1 x 5.6 cm

Left ovary

Measurements: 2.8 x 1.7 x 1.7 cm. Normal appearance/no adnexal mass.

Pulsed Doppler evaluation demonstrates normal low-resistance
arterial and venous waveforms in both ovaries. Trace free fluid in
the pelvis.
IMPRESSION: 1. Negative for ovarian torsion
2. Large 6.8 cm simple appearing cyst. This is almost certainly
benign, but follow up ultrasound is recommended in 1 year according
to the Society of Radiologists in MltrasoundD6L6 Consensus
Conference Statement (Jaider Andres Nando et al. Management of Asymptomatic
Ovarian and Other Adnexal Cysts Imaged at US: Society of
Radiologists in Ultrasound Consensus Conference Statement 0585.
Radiology [DATE]): [id number].).
3. Trace free fluid in the pelvis

## 2019-03-29 IMAGING — US US TRANSVAGINAL NON-OB
1 series · 13 of 25 positions shown · non-contrast
Comparison: Pelvic ultrasound January 12, 2016; CT abdomen and pelvis

CLINICAL DATA: Pelvic pain

EXAM:
TRANSABDOMINAL AND TRANSVAGINAL ULTRASOUND OF PELVIS
DOPPLER ULTRASOUND OF OVARIES
TECHNIQUE: Study was performed transabdominally to optimize pelvic field of
view evaluation and transvaginally to optimize internal visceral
architecture evaluation.
Color and duplex Doppler ultrasound was utilized to evaluate blood
flow to the ovaries.

[Series 1: us transvaginal non-ob · 0.22mm/px · 13 of 104 slices shown]
[im 1/104]
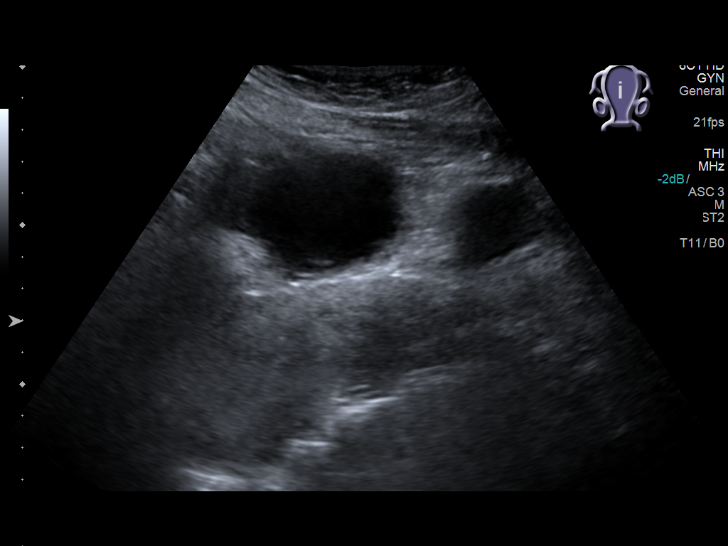
[im 9/104]
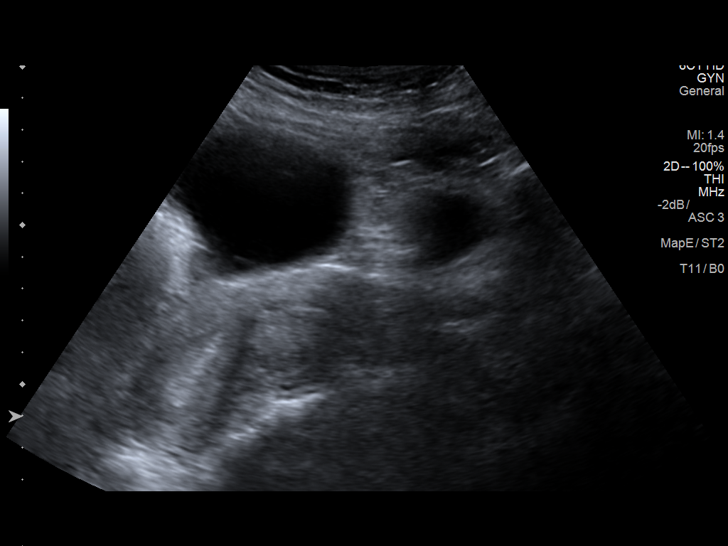
[im 18/104]
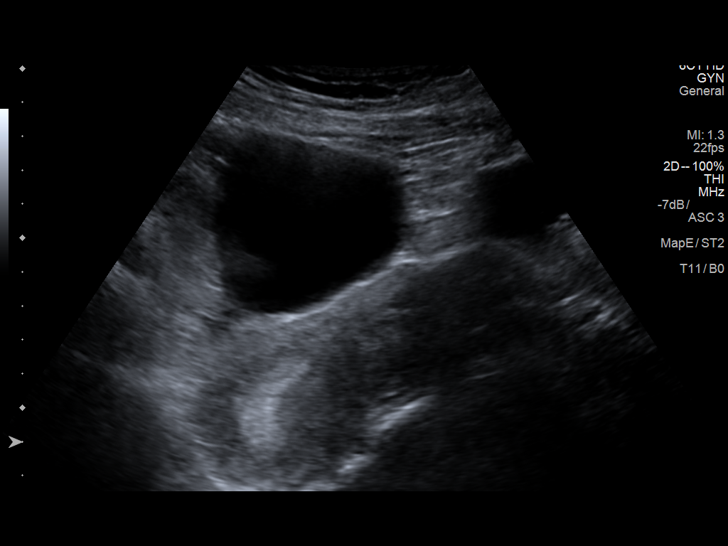
[im 26/104]
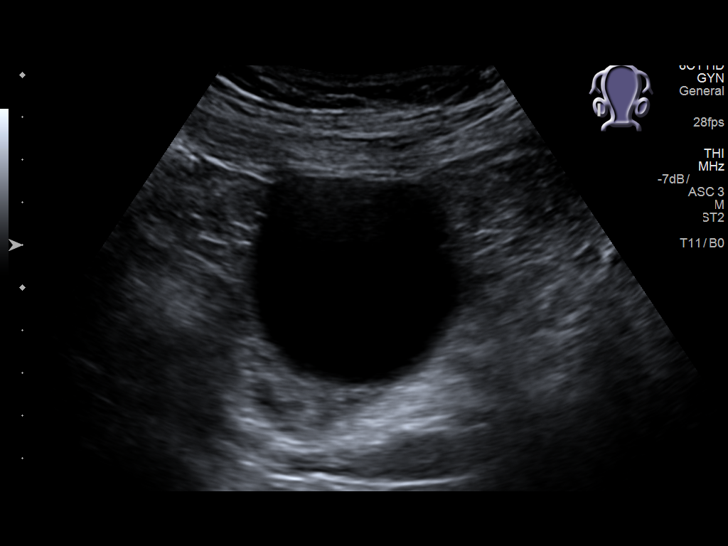
[im 35/104]
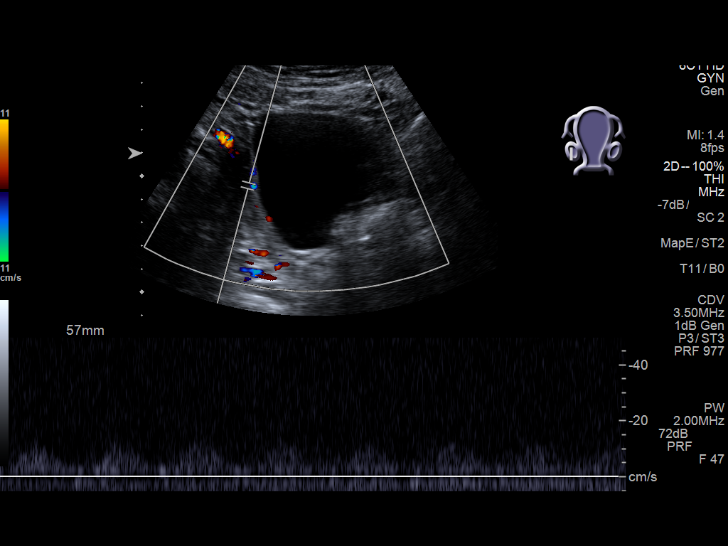
[im 43/104]
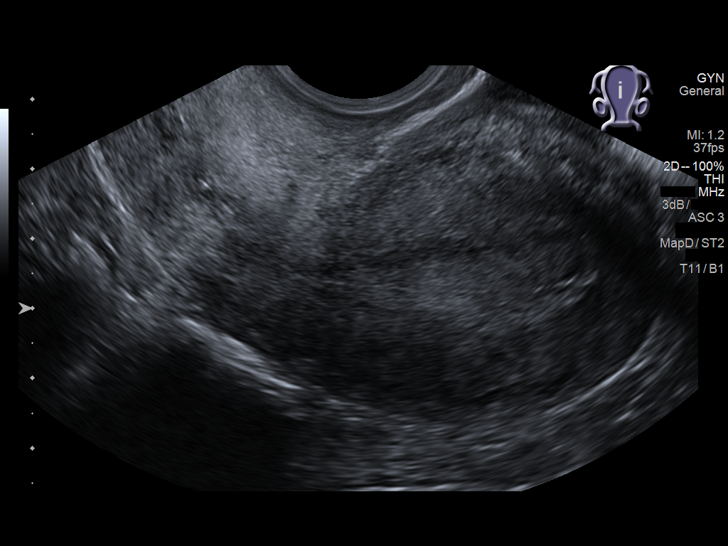
[im 52/104]
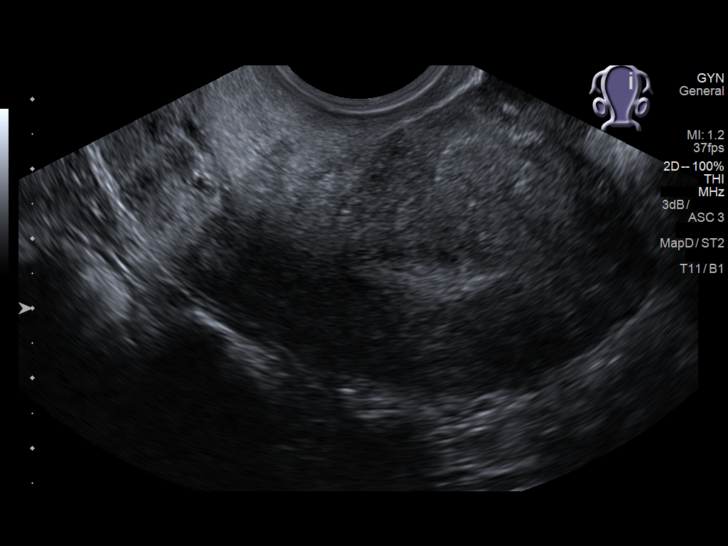
[im 61/104]
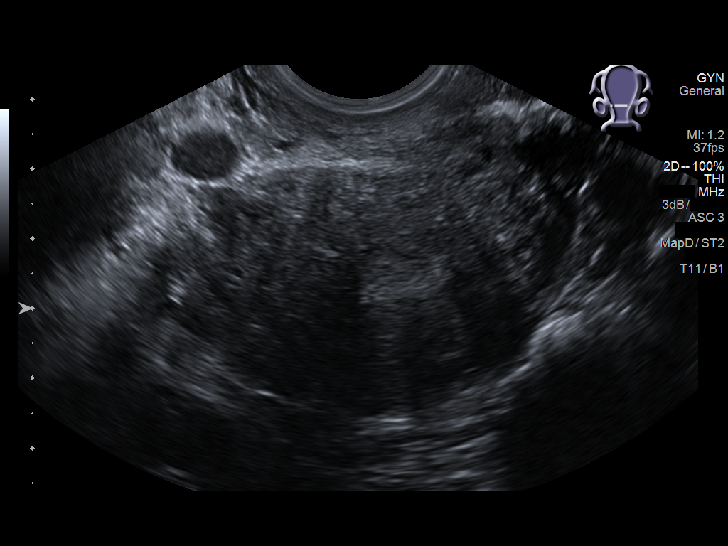
[im 69/104]
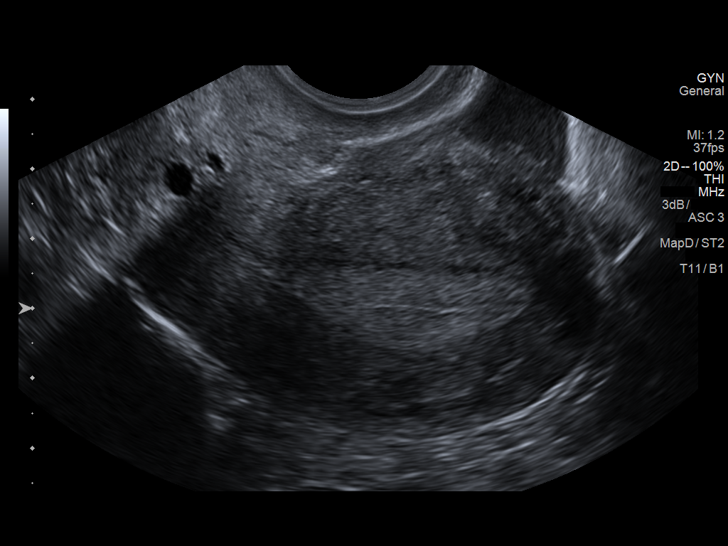
[im 78/104]
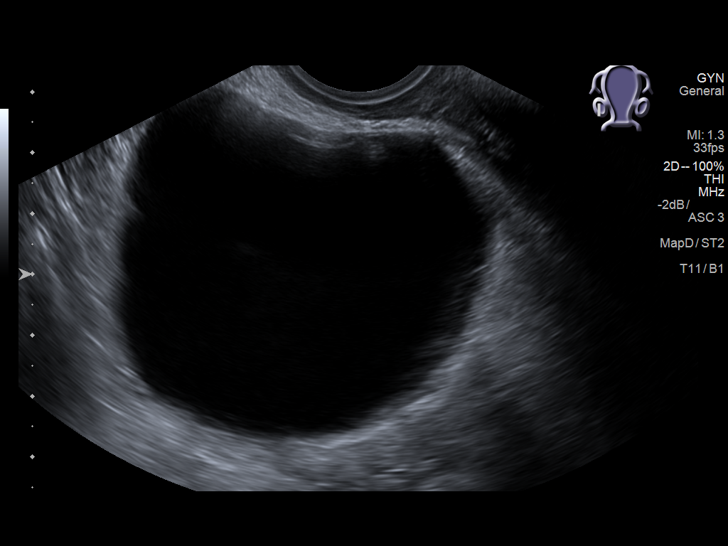
[im 86/104]
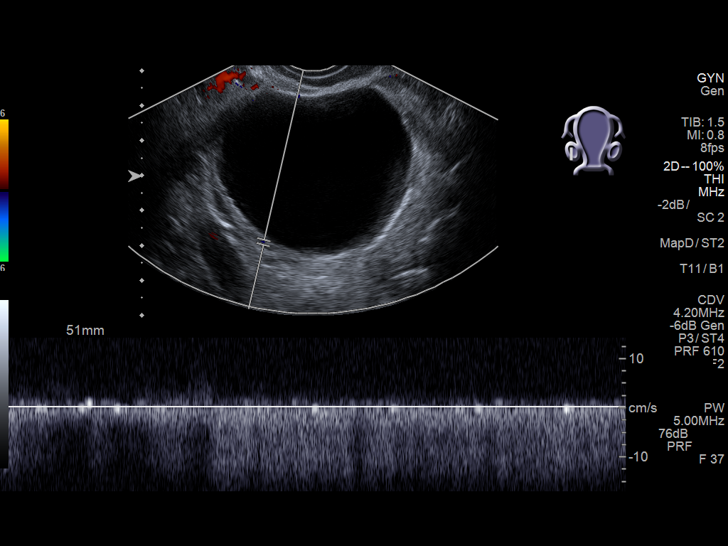
[im 95/104]
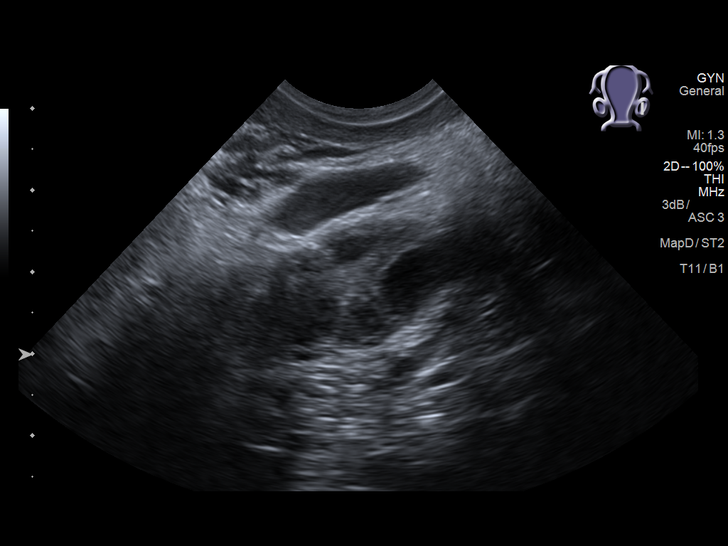
[im 104/104]
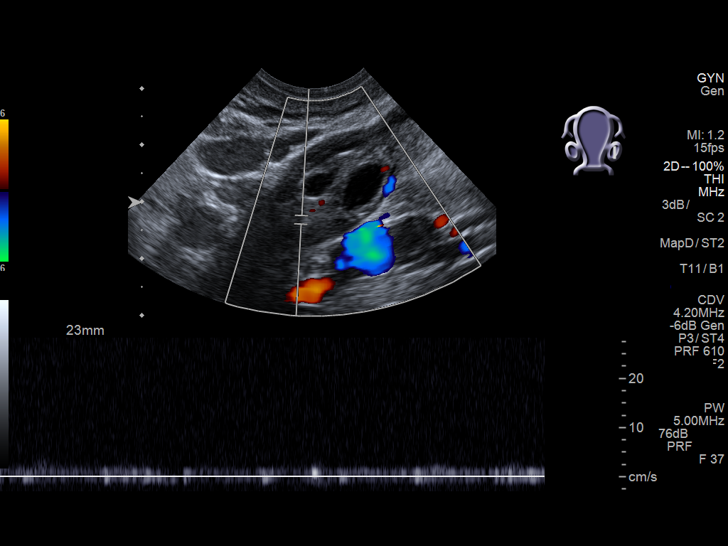

[13 of 25 positions shown; findings below may reference images not displayed]

FINDINGS: Uterus

Measurements: 9.4 x 4.6 x 5.1 cm. No fibroids or other mass
visualized.

Endometrium

Thickness: 12 mm.  No focal abnormality visualized.

Right ovary

Measurements: 6.7 x 5.2 x 6.3 cm.. There is a a cyst arising from
the left ovary measuring 6.4 x 4.8 x 5.8 cm. No other right-sided
pelvic mass evident.

Left ovary

Measurements: 2.8 x 1.7 x 1.7 cm. Normal appearance/no adnexal mass.

Pulsed Doppler evaluation of both ovaries demonstrates normal
low-resistance arterial and venous waveforms.

Other findings

There is a small amount of free pelvic fluid.  Free fluid.
IMPRESSION: 1. There is a cyst arising from the right ovary measuring 6.4 x
x 5.8 cm. Right ovary otherwise appears normal.

2.  Left ovary appears normal.

3. Small amount of free pelvic fluid may represent recent ovarian
leakage or rupture.

4. No ovarian torsion evident. Low resistance waveforms noted in
each ovary.

5.  Uterus and endometrium appear normal.

The right ovarian cyst is almost certainly benign, but follow up
ultrasound is recommended in 1 year according to the Society of
Radiologists in Kltrasound0H3H Consensus Conference Statement ([employer]
Dimitry et al. Management of Asymptomatic Ovarian and Other Adnexal
Cysts Imaged at US: Society of Radiologists in Ultrasound Consensus

## 2019-07-03 ENCOUNTER — Ambulatory Visit
Admission: EM | Admit: 2019-07-03 | Discharge: 2019-07-03 | Disposition: A | Discharge status: Home / Self Care | Payer: Worker's Compensation | Attending: Family Medicine | Admitting: Family Medicine

## 2019-07-03 ENCOUNTER — Ambulatory Visit (INDEPENDENT_AMBULATORY_CARE_PROVIDER_SITE_OTHER): Payer: Worker's Compensation

## 2019-07-03 ENCOUNTER — Other Ambulatory Visit: Payer: Self-pay

## 2019-07-03 DIAGNOSIS — S63502A Unspecified sprain of left wrist, initial encounter: Secondary | ICD-10-CM | POA: Diagnosis not present

## 2019-07-03 DIAGNOSIS — M25532 Pain in left wrist: Secondary | ICD-10-CM

## 2019-07-03 DIAGNOSIS — W19XXXA Unspecified fall, initial encounter: Secondary | ICD-10-CM | POA: Diagnosis not present

## 2019-07-03 DIAGNOSIS — Y990 Civilian activity done for income or pay: Secondary | ICD-10-CM

## 2019-07-03 MED ORDER — HYDROCODONE-ACETAMINOPHEN 5-325 MG PO TABS: ORAL_TABLET | ORAL | 0 refills | Status: DC

## 2019-07-03 NOTE — ED Provider Notes (Signed)
[facility]    CSN: [id number] Arrival date & time: 07/03/19  1716      History   Chief Complaint Chief Complaint  Patient presents with  . Work Related Injury  . Fall  . Wrist Pain    HPI Alexis [name] is a 33 y.o. female.   33 yo female with a c/o left wrist pain after falling at work today and landing on her outstretched hand. Complains of pain and swelling. Has been putting ice and took ibuprofen.    Fall  Wrist Pain    Past Medical History:  Diagnosis Date  . Anemia   . Hypoglycemia   . Irritable bowel syndrome   . Kidney stones     There are no active problems to display for this patient.   Past Surgical History:  Procedure Laterality Date  . CHOLECYSTECTOMY    . TUBAL LIGATION      OB History    Gravida  3   Para  3   Term  3   Preterm      AB      Living  3     SAB      TAB      Ectopic      Multiple      Live Births  3            Home Medications    Prior to Admission medications   Medication Sig Start Date End Date Taking? Authorizing Provider  azithromycin (ZITHROMAX Z-PAK) 250 MG tablet 2 pills today then 1 pill a day for 4 days 09/20/18   Caryn Section Linden Dolin, PA-C  HYDROcodone-acetaminophen (NORCO/VICODIN) 5-325 MG tablet 1-2 tabs po bid prn 07/03/19   Norval Gable, MD    Family History Family History  Problem Relation Age of Onset  . Diabetes Father   . Cancer Maternal Aunt   . Cancer Paternal Aunt   . Breast cancer Maternal Grandmother   . Diabetes Paternal Grandfather     Social History Social History   Tobacco Use  . Smoking status: Never Smoker  . Smokeless tobacco: Never Used  Substance Use Topics  . Alcohol use: Yes    Comment: rarely  . Drug use: No     Allergies   Morphine and related   Review of Systems Review of Systems   Physical Exam Triage Vital Signs ED Triage Vitals [07/03/19 1740]  Enc Vitals Group     BP 121/72     Pulse Rate 68     Resp 16   Temp 98.2 F (36.8 C)     Temp Source Oral     SpO2 100 %     Weight 213 lb (96.6 kg)     Height 5\' 9"  (1.753 m)     Head Circumference      Peak Flow      Pain Score 2     Pain Loc      Pain Edu?      Excl. in [place]?    No data found.  Updated Vital Signs BP 121/72 (BP Location: Right Arm)   Pulse 68   Temp 98.2 F (36.8 C) (Oral)   Resp 16   Ht 5\' 9"  (1.753 m)   Wt 96.6 kg   LMP 06/12/2019   SpO2 100%   BMI 31.45 kg/m   Visual Acuity Right Eye Distance:   Left Eye Distance:   Bilateral Distance:    Right Eye Near:  Left Eye Near:    Bilateral Near:     Physical Exam Vitals signs and nursing note reviewed.  Constitutional:      General: She is not in acute distress.    Appearance: She is not toxic-appearing or diaphoretic.  Musculoskeletal:     Left wrist: She exhibits tenderness, bony tenderness and swelling. She exhibits normal range of motion, no effusion, no crepitus, no deformity and no laceration.     Comments: left hand neurovascularly intact  Neurological:     Mental Status: She is alert.      UC Treatments / Results  Labs (all labs ordered are listed, but only abnormal results are displayed) Labs Reviewed - No data to display  EKG   Radiology No results found.  Procedures Procedures (including critical care time)  Medications Ordered in UC Medications - No data to display  Initial Impression / Assessment and Plan / UC Course  I have reviewed the triage vital signs and the nursing notes.  Pertinent labs & imaging results that were available during my care of the patient were reviewed by me and considered in my medical decision making (see chart for details).      Final Clinical Impressions(s) / UC Diagnoses   Final diagnoses:  Sprain of left wrist, initial encounter     Discharge Instructions     Rest, ice    ED Prescriptions    Medication Sig Dispense Auth. Provider   HYDROcodone-acetaminophen (NORCO/VICODIN) 5-325 MG  tablet 1-2 tabs po bid prn 6 tablet Payton Mccallum, MD      1. X-ray results (negative) and diagnosis reviewed with patient 2. rx as per orders above; reviewed possible side effects, interactions, risks and benefits  3. Recommend supportive treatment as above 4. Follow-up prn  I have reviewed the PDMP during this encounter.   Payton Mccallum, MD 07/13/19 [phone]

## 2019-07-03 NOTE — ED Triage Notes (Signed)
Patient complains of a work related injury that occurred around 3pm. Patient states that she tripped over a piece of cardboard and fell with her hands out. Patient with pain and swelling to left wrist.

## 2019-07-03 NOTE — Discharge Instructions (Signed)
Rest, ice

## 2019-08-04 ENCOUNTER — Other Ambulatory Visit: Payer: Self-pay

## 2019-08-04 ENCOUNTER — Emergency Department
Admission: EM | Admit: 2019-08-04 | Discharge: 2019-08-04 | Disposition: A | Discharge status: Home / Self Care | Payer: No Typology Code available for payment source | Attending: Emergency Medicine | Admitting: Emergency Medicine

## 2019-08-04 ENCOUNTER — Encounter: Payer: Self-pay | Admitting: Medical Oncology

## 2019-08-04 ENCOUNTER — Telehealth: Payer: Self-pay | Admitting: Physician Assistant

## 2019-08-04 DIAGNOSIS — F419 Anxiety disorder, unspecified: Secondary | ICD-10-CM | POA: Diagnosis not present

## 2019-08-04 DIAGNOSIS — Y999 Unspecified external cause status: Secondary | ICD-10-CM | POA: Insufficient documentation

## 2019-08-04 DIAGNOSIS — M62838 Other muscle spasm: Secondary | ICD-10-CM | POA: Diagnosis not present

## 2019-08-04 DIAGNOSIS — Y92410 Unspecified street and highway as the place of occurrence of the external cause: Secondary | ICD-10-CM | POA: Insufficient documentation

## 2019-08-04 DIAGNOSIS — Y9389 Activity, other specified: Secondary | ICD-10-CM | POA: Diagnosis not present

## 2019-08-04 DIAGNOSIS — M545 Low back pain: Secondary | ICD-10-CM | POA: Insufficient documentation

## 2019-08-04 DIAGNOSIS — M7918 Myalgia, other site: Secondary | ICD-10-CM

## 2019-08-04 MED ORDER — LORAZEPAM 1 MG PO TABS
2.0000 mg | ORAL_TABLET | Freq: Once | ORAL | Status: AC
  Administered 2019-08-04: 2 mg via ORAL
  Filled 2019-08-04: qty 2

## 2019-08-04 MED ORDER — TRAMADOL HCL 50 MG PO TABS: 50.0000 mg | ORAL_TABLET | Freq: Four times a day (QID) | ORAL | 0 refills | Status: DC | PRN

## 2019-08-04 MED ORDER — IBUPROFEN 600 MG PO TABS: 600.0000 mg | ORAL_TABLET | Freq: Four times a day (QID) | ORAL | 0 refills | Status: DC | PRN

## 2019-08-04 MED ORDER — IBUPROFEN 600 MG PO TABS: 600.0000 mg | ORAL_TABLET | Freq: Three times a day (TID) | ORAL | 0 refills | Status: DC | PRN

## 2019-08-04 MED ORDER — LIDOCAINE 5 % EX PTCH
2.0000 | MEDICATED_PATCH | CUTANEOUS | Status: DC
  Administered 2019-08-04: 2 via TRANSDERMAL
  Filled 2019-08-04: qty 2

## 2019-08-04 MED ORDER — CYCLOBENZAPRINE HCL 10 MG PO TABS: 10.0000 mg | ORAL_TABLET | Freq: Three times a day (TID) | ORAL | 0 refills | Status: DC | PRN
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
## 2019-08-04 NOTE — Discharge Instructions (Signed)
Follow discharge care instruction take medication as directed.  Follow-up PCP. 

## 2019-08-04 NOTE — Telephone Encounter (Signed)
Patient called would like her medications sent to [facility] on [address] instead of [facility] as they will not refill her medications.  She states that they refused the prescription because she has not been seen in the clinic.  She has a prescription for tramadol, Flexeril, and ibuprofen.

## 2019-11-16 ENCOUNTER — Emergency Department
Admission: EM | Admit: 2019-11-16 | Discharge: 2019-11-16 | Disposition: A | Discharge status: Home / Self Care | Payer: BLUE CROSS/BLUE SHIELD | Attending: Student | Admitting: Student

## 2019-11-16 ENCOUNTER — Encounter: Payer: Self-pay | Admitting: *Deleted

## 2019-11-16 ENCOUNTER — Other Ambulatory Visit: Payer: Self-pay

## 2019-11-16 ENCOUNTER — Emergency Department: Payer: BLUE CROSS/BLUE SHIELD

## 2019-11-16 DIAGNOSIS — R109 Unspecified abdominal pain: Secondary | ICD-10-CM | POA: Diagnosis not present

## 2019-11-16 LAB — CBC
HCT: 37.9 % (ref 36.0–46.0)
Hemoglobin: 12.4 g/dL (ref 12.0–15.0)
MCH: 28.1 pg (ref 26.0–34.0)
MCHC: 32.7 g/dL (ref 30.0–36.0)
MCV: 85.7 fL (ref 80.0–100.0)
Platelets: 260 10*3/uL (ref 150–400)
RBC: 4.42 MIL/uL (ref 3.87–5.11)
RDW: 12.6 % (ref 11.5–15.5)
WBC: 9.3 10*3/uL (ref 4.0–10.5)
nRBC: 0 % (ref 0.0–0.2)

## 2019-11-16 LAB — URINALYSIS, COMPLETE (UACMP) WITH MICROSCOPIC
Bacteria, UA: NONE SEEN
Bilirubin Urine: NEGATIVE
Glucose, UA: NEGATIVE mg/dL
Hgb urine dipstick: NEGATIVE
Ketones, ur: NEGATIVE mg/dL
Nitrite: NEGATIVE
Protein, ur: NEGATIVE mg/dL
Specific Gravity, Urine: 1.014 (ref 1.005–1.030)
pH: 7 (ref 5.0–8.0)

## 2019-11-16 LAB — COMPREHENSIVE METABOLIC PANEL
ALT: 19 U/L (ref 0–44)
AST: 18 U/L (ref 15–41)
Albumin: 4.3 g/dL (ref 3.5–5.0)
Alkaline Phosphatase: 56 U/L (ref 38–126)
Anion gap: 7 (ref 5–15)
BUN: 16 mg/dL (ref 6–20)
CO2: 24 mmol/L (ref 22–32)
Calcium: 9.2 mg/dL (ref 8.9–10.3)
Chloride: 104 mmol/L (ref 98–111)
Creatinine, Ser: 0.66 mg/dL (ref 0.44–1.00)
GFR calc Af Amer: >60 mL/min (ref >60)
GFR calc non Af Amer: >60 mL/min (ref >60)
Glucose, Bld: 101 mg/dL — ABNORMAL HIGH (ref 70–99)
Potassium: 3.9 mmol/L (ref 3.5–5.1)
Sodium: 135 mmol/L (ref 135–145)
Total Bilirubin: 0.6 mg/dL (ref 0.3–1.2)
Total Protein: 7.6 g/dL (ref 6.5–8.1)

## 2019-11-16 LAB — POCT PREGNANCY, URINE: Preg Test, Ur: NEGATIVE

## 2019-11-16 LAB — LIPASE, BLOOD: Lipase: 37 U/L (ref 11–51)

## 2019-11-16 MED ORDER — SODIUM CHLORIDE 0.9% FLUSH: 3.0000 mL | Freq: Once | INTRAVENOUS | Status: DC

## 2019-11-16 MED ORDER — NAPROXEN 500 MG PO TABS: 500.0000 mg | ORAL_TABLET | Freq: Two times a day (BID) | ORAL | 0 refills | Status: AC

## 2019-11-16 NOTE — ED Triage Notes (Signed)
Pt has left abd pain for 1 day.  No n/v/d  No back pain.  No urinary sx.  No vag discharge or bleeding. Pt alert.  Speech clear.  Sx for 1 day

## 2019-11-16 NOTE — ED Provider Notes (Signed)
[facility] Emergency Department Provider Note  ____________________________________________   First MD Initiated Contact with Patient 11/16/19 2002     (approximate)  I have reviewed the triage vital signs and the nursing notes.  History  Chief Complaint Abdominal Pain    HPI Alexis [name] is a 34 y.o. female with history of ovarian cysts, nephrolithiasis who presents to the emergency department for left-sided abdominal pain.  Patient states pain started this afternoon while at work.  Located in the left mid abdomen area.  Describes it as sharp.  Currently 5/10 in severity.  Radiates somewhat to the lateral abdomen/beginning flank area.  Worsened with standing and with movement.  Improved with rest and laying down.  Does do a lot of heavy lifting at work.  Denies any fevers or chills.  No nausea or vomiting.  No diarrhea.  No dysuria, hematuria, malodorous urine.  No vaginal discharge or bleeding.  Does have a history of kidney stones, but states previously with her stones her pain was more posterior and in her back, this pain feels more anterior.   Past Medical Hx Past Medical History:  Diagnosis Date  . Anemia   . Hypoglycemia   . Irritable bowel syndrome   . Kidney stones     Problem List There are no problems to display for this patient.   Past Surgical Hx Past Surgical History:  Procedure Laterality Date  . CHOLECYSTECTOMY    . TUBAL LIGATION      Medications Prior to Admission medications   Medication Sig Start Date End Date Taking? Authorizing Provider  cyclobenzaprine (FLEXERIL) 10 MG tablet Take 1 tablet (10 mg total) by mouth 3 (three) times daily as needed. 08/04/19   Joni Reining, PA-C  cyclobenzaprine (FLEXERIL) 10 MG tablet Take 1 tablet (10 mg total) by mouth 3 (three) times daily as needed. 08/04/19   Fisher, Roselyn Bering, PA-C  ibuprofen (ADVIL) 600 MG tablet Take 1 tablet (600 mg total) by mouth every 8 (eight) hours as  needed. 08/04/19   Joni Reining, PA-C  ibuprofen (ADVIL) 600 MG tablet Take 1 tablet (600 mg total) by mouth every 6 (six) hours as needed. 08/04/19   Fisher, Roselyn Bering, PA-C  traMADol (ULTRAM) 50 MG tablet Take 1 tablet (50 mg total) by mouth every 6 (six) hours as needed. 08/04/19 08/03/20  Joni Reining, PA-C  traMADol (ULTRAM) 50 MG tablet Take 1 tablet (50 mg total) by mouth every 6 (six) hours as needed. 08/04/19   Faythe Ghee, PA-C    Allergies Morphine and related  Family Hx Family History  Problem Relation Age of Onset  . Diabetes Father   . Cancer Maternal Aunt   . Cancer Paternal Aunt   . Breast cancer Maternal Grandmother   . Diabetes Paternal Grandfather     Social Hx Social History   Tobacco Use  . Smoking status: Never Smoker  . Smokeless tobacco: Never Used  Substance Use Topics  . Alcohol use: Not Currently    Comment: rarely  . Drug use: No     Review of Systems  Constitutional: Negative for fever, chills. Eyes: Negative for visual changes. ENT: Negative for sore throat. Cardiovascular: Negative for chest pain. Respiratory: Negative for shortness of breath. Gastrointestinal: Negative for nausea, vomiting.  Positive for left-sided abdominal pain. Genitourinary: Negative for dysuria. Musculoskeletal: Negative for leg swelling. Skin: Negative for rash. Neurological: Negative for headaches.   Physical Exam  Vital Signs: ED Triage Vitals  Enc Vitals Group     BP 11/16/19 1748 115/62     Pulse Rate 11/16/19 1748 65     Resp 11/16/19 1748 20     Temp 11/16/19 1748 98.5 F (36.9 C)     Temp Source 11/16/19 1748 Oral     SpO2 11/16/19 1748 97 %     Weight 11/16/19 1749 215 lb (97.5 kg)     Height 11/16/19 1749 5\' 7"  (1.702 m)     Head Circumference --      Peak Flow --      Pain Score 11/16/19 1749 5     Pain Loc --      Pain Edu? --      Excl. in [place]? --     Constitutional: Alert and oriented. Well appearing.  Head: Normocephalic.  Atraumatic. Eyes: Conjunctivae clear, sclera anicteric. Pupils equal and symmetric. Nose: No masses or lesions. No congestion or rhinorrhea. Mouth/Throat: Wearing mask.  Neck: No stridor. Trachea midline.  Cardiovascular: Normal rate, regular rhythm. Extremities well perfused. Respiratory: Normal respiratory effort.  Lungs CTAB. Gastrointestinal: Soft. Non-distended.  Mild tenderness in the mid left abdomen.  No rebound or guarding.  Remainder of abdomen is soft and nontender. Genitourinary: Deferred. Musculoskeletal: No lower extremity edema. No deformities. Neurologic:  Normal speech and language. No gross focal or lateralizing neurologic deficits are appreciated.  Skin: Skin is warm, dry and intact. No rash noted.  No vesicles or lesions. Psychiatric: Mood and affect are appropriate for situation.  EKG  N/A   Radiology  CT renal IMPRESSION:  1. A cause for the patient's left flank pain is not identified. No  urinary tract calculi.  2. Hypodensity along the right adnexa about 2.1 by 2.7 cm, likely a  cyst.    Procedures  Procedure(s) performed (including critical care):  Procedures   Initial Impression / Assessment and Plan / MDM / ED Course  34 y.o. female who presents to the ED for mid left abdominal pain, as above  Ddx: nephroureterolithiasis, MSK (patient does a lot of heavy lifting at work), GERD, gastritis  Will evaluate with labs, imaging  Work-up largely unremarkable.  CT imaging negative.  UA negative for infection.  Labs without actionable derangements.  Given negative work-up, feel patient is stable for discharge with outpatient follow-up.  In the setting of her heavy lifting at work, will trial twice daily naproxen and evaluate for improvement.  Discussed that if this worsens her GERD type symptoms, it is acceptable to stop the course of NSAID.  Advised follow-up with PCP and given return precautions.  Patient voices understanding and is comfortable to plan  at discharge.  _______________________________  As part of my medical decision making I have reviewed available labs, radiology tests, reviewed old records.  Final Clinical Impression(s) / ED Diagnosis  Final diagnoses:  Left sided abdominal pain       Note:  This document was prepared using Dragon voice recognition software and may include unintentional dictation errors.   Lilia Pro., MD 11/16/19 2136

## 2019-11-16 NOTE — Discharge Instructions (Addendum)
Thank you for letting us take care of you in the emergency department today.   Please continue to take any regular, prescribed medications.   New medications we have prescribed:  Naproxen (also found over the counter under the name Aleve). Take twice a day with food for 1 week. It is okay to stop this early if this bothers your reflux too much.  Please follow up with: Your primary care doctor to review your ER visit and follow up on your symptoms.    Please return to the ER for any new or worsening symptoms.

## 2020-01-29 ENCOUNTER — Emergency Department: Payer: BLUE CROSS/BLUE SHIELD

## 2020-01-29 ENCOUNTER — Encounter: Payer: Self-pay | Admitting: Emergency Medicine

## 2020-01-29 ENCOUNTER — Other Ambulatory Visit: Payer: Self-pay

## 2020-01-29 ENCOUNTER — Emergency Department
Admission: EM | Admit: 2020-01-29 | Discharge: 2020-01-29 | Disposition: A | Discharge status: Home / Self Care | Payer: BLUE CROSS/BLUE SHIELD | Attending: Emergency Medicine | Admitting: Emergency Medicine

## 2020-01-29 DIAGNOSIS — S0990XA Unspecified injury of head, initial encounter: Secondary | ICD-10-CM | POA: Insufficient documentation

## 2020-01-29 DIAGNOSIS — F0781 Postconcussional syndrome: Secondary | ICD-10-CM | POA: Insufficient documentation

## 2020-01-29 DIAGNOSIS — Y929 Unspecified place or not applicable: Secondary | ICD-10-CM | POA: Insufficient documentation

## 2020-01-29 DIAGNOSIS — W19XXXA Unspecified fall, initial encounter: Secondary | ICD-10-CM

## 2020-01-29 DIAGNOSIS — W010XXA Fall on same level from slipping, tripping and stumbling without subsequent striking against object, initial encounter: Secondary | ICD-10-CM | POA: Insufficient documentation

## 2020-01-29 DIAGNOSIS — M542 Cervicalgia: Secondary | ICD-10-CM | POA: Insufficient documentation

## 2020-01-29 DIAGNOSIS — Y9301 Activity, walking, marching and hiking: Secondary | ICD-10-CM | POA: Insufficient documentation

## 2020-01-29 DIAGNOSIS — M25522 Pain in left elbow: Secondary | ICD-10-CM | POA: Diagnosis not present

## 2020-01-29 DIAGNOSIS — Z79899 Other long term (current) drug therapy: Secondary | ICD-10-CM | POA: Diagnosis not present

## 2020-01-29 DIAGNOSIS — Y999 Unspecified external cause status: Secondary | ICD-10-CM | POA: Diagnosis not present

## 2020-01-29 DIAGNOSIS — M545 Low back pain: Secondary | ICD-10-CM | POA: Insufficient documentation

## 2020-01-29 NOTE — ED Triage Notes (Signed)
Pt presents to ED via POV, pt reports last night was knocked down by her mom's dog while walking it. Pt states took ibuprofen for pain last night, pt reports continued pain to posterior head. Pt A&O x4, ambulatory to triage. Pt c/o L sided back pain, posterior head pain, and L elbow pain that radiates down to her wrist. Pt with full ROM noted to L arm at this time. Pt reports intermittent confusion PTA, however on arrival to ED, pt is noted to be A&O x4.

## 2020-01-29 NOTE — ED Provider Notes (Signed)
[facility] Emergency Department Provider Note   ____________________________________________   First MD Initiated Contact with Patient 01/29/20 1419     (approximate)  I have reviewed the triage vital signs and the nursing notes.   HISTORY  Chief Complaint Fall    HPI Alexis [name] is a 34 y.o. female patient presents with headache, left elbow pain, left wrist pain, neck and back pain status post fall last night.  Patient states she was walking her mother's dog last night when he pulled ahead of her and her ankle became wrapped in the leash and she fell backwards hitting her head.  Patient denies LOC.  Patient is a status post accident she is experiencing intermittent confusion and unable to concentrate at work today.  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  Patient denies nausea, blurry vision, or weakness.  Patient denies bladder or bowel dysfunction.  Patient denies radicular component to her neck pain.  Patient denies loss sensation to the upper extremity.  Patient rates the headache as 4/10.  Patient described headache as "achy".         Past Medical History:  Diagnosis Date  . Anemia   . Hypoglycemia   . Irritable bowel syndrome   . Kidney stones     There are no problems to display for this patient.   Past Surgical History:  Procedure Laterality Date  . CHOLECYSTECTOMY    . TUBAL LIGATION      Prior to Admission medications   Medication Sig Start Date End Date Taking? Authorizing Provider  cyclobenzaprine (FLEXERIL) 10 MG tablet Take 1 tablet (10 mg total) by mouth 3 (three) times daily as needed. 08/04/19   Joni Reining, PA-C  cyclobenzaprine (FLEXERIL) 10 MG tablet Take 1 tablet (10 mg total) by mouth 3 (three) times daily as needed. 08/04/19   Fisher, Roselyn Bering, PA-C  ibuprofen (ADVIL) 600 MG tablet Take 1 tablet (600 mg total) by mouth every 8 (eight) hours as needed.  08/04/19   Joni Reining, PA-C  ibuprofen (ADVIL) 600 MG tablet Take 1 tablet (600 mg total) by mouth every 6 (six) hours as needed. 08/04/19   Fisher, Roselyn Bering, PA-C  traMADol (ULTRAM) 50 MG tablet Take 1 tablet (50 mg total) by mouth every 6 (six) hours as needed. 08/04/19 08/03/20  Joni Reining, PA-C  traMADol (ULTRAM) 50 MG tablet Take 1 tablet (50 mg total) by mouth every 6 (six) hours as needed. 08/04/19   Faythe Ghee, PA-C    Allergies Morphine and related  Family History  Problem Relation Age of Onset  . Diabetes Father   . Cancer Maternal Aunt   . Cancer Paternal Aunt   . Breast cancer Maternal Grandmother   . Diabetes Paternal Grandfather     Social History Social History   Tobacco Use  . Smoking status: Never Smoker  . Smokeless tobacco: Never Used  Substance Use Topics  . Alcohol use: Not Currently    Comment: rarely  . Drug use: No    Review of Systems Constitutional: No fever/chills Eyes: No visual changes. ENT: No sore throat. Cardiovascular: Denies chest pain. Respiratory: Denies shortness of breath. Gastrointestinal: No abdominal pain.  No nausea, no vomiting.  No diarrhea.  No constipation. Genitourinary: Negative for dysuria. Musculoskeletal: Positive for neck, back, and left elbow pain. Skin: Negative for rash. Neurological: Positive for headaches, but denies focal weakness or numbness. Allergic/Immunilogical: Morphine.  ____________________________________________   PHYSICAL EXAM:  VITAL SIGNS: ED Triage Vitals  Enc Vitals Group     BP 01/29/20 1309 135/78     Pulse Rate 01/29/20 1309 62     Resp 01/29/20 1309 18     Temp 01/29/20 1309 98.4 F (36.9 C)     Temp Source 01/29/20 1309 Oral     SpO2 01/29/20 1309 97 %     Weight 01/29/20 1309 215 lb (97.5 kg)     Height 01/29/20 1309 5\' 9"  (1.753 m)     Head Circumference --      Peak Flow --      Pain Score 01/29/20 1316 4     Pain Loc --      Pain Edu? --      Excl. in [place]? --      Constitutional: Alert and oriented. Well appearing and in no acute distress. Eyes: Conjunctivae are normal. PERRL. EOMI. Head: Atraumatic. Nose: No congestion/rhinnorhea. Mouth/Throat: Mucous membranes are moist.  Oropharynx non-erythematous. Neck: No stridor.   cervical spine tenderness to palpation to C4-5.  Decreased range of motion limited by complaint of pain with flexion. Cardiovascular: Normal rate, regular rhythm. Grossly normal heart sounds.  Good peripheral circulation. Respiratory: Normal respiratory effort.  No retractions. Lungs CTAB. Gastrointestinal: Soft and nontender. No distention. No abdominal bruits. No CVA tenderness. Genitourinary: Deferred  musculoskeletal: No obvious deformity to the left elbow.  Patient is moderate guarding palpation of the olecranon process.  Neurologic:  Normal speech and language. No gross focal neurologic deficits are appreciated. No gait instability. Skin:  Skin is warm, dry and intact. No rash noted.  Abrasion left elbow. Psychiatric: Mood and affect are normal. Speech and behavior are normal.  ____________________________________________   LABS (all labs ordered are listed, but only abnormal results are displayed)  Labs Reviewed - No data to display ____________________________________________  EKG   ____________________________________________  RADIOLOGY  ED MD interpretation:    Official radiology report(s): DG Lumbar Spine 2-3 Views  Result Date: 01/29/2020 CLINICAL DATA:  Pain after fall EXAM: LUMBAR SPINE - 2-3 VIEW COMPARISON:  11/16/2019 FINDINGS: Frontal and lateral views of the lumbar spine demonstrate 5 non-rib-bearing lumbar type vertebral bodies in anatomic alignment. No acute displaced fractures. Disc spaces are well preserved. Sacroiliac joints are normal. IMPRESSION: 1. Unremarkable lumbar spine. Electronically Signed   By: 01/16/2020 M.D.   On: 01/29/2020 14:57   DG Elbow 2 Views Left  Result Date:  01/29/2020 CLINICAL DATA:  Pain after fall EXAM: LEFT ELBOW - 2 VIEW COMPARISON:  None. FINDINGS: Frontal and lateral views of the left elbow demonstrate no fractures. Alignment is anatomic. Joint spaces are well preserved. No joint effusion. Soft tissues are unremarkable. IMPRESSION: 1. Unremarkable left elbow. Electronically Signed   By: 01/31/2020 M.D.   On: 01/29/2020 14:56   CT Head Wo Contrast  Result Date: 01/29/2020 CLINICAL DATA:  Pain following fall EXAM: CT HEAD WITHOUT CONTRAST CT CERVICAL SPINE WITHOUT CONTRAST TECHNIQUE: Multidetector CT imaging of the head and cervical spine was performed following the standard protocol without intravenous contrast. Multiplanar CT image reconstructions of the cervical spine were also generated. COMPARISON:  CT head and CT cervical spine November 07, 2014 FINDINGS: CT HEAD FINDINGS Brain: The ventricles and sulci are normal in size and configuration. There is no intracranial mass, hemorrhage, extra-axial fluid collection, or midline shift. The brain parenchyma appears unremarkable. No acute infarct evident. Vascular: No hyperdense vessel.  No vascular calcification evident. Skull:  The bony calvarium appears intact. Sinuses/Orbits: There is mucosal thickening in several ethmoid air cells. Orbits appear symmetric bilaterally. Other: Mastoid air cells are clear. CT CERVICAL SPINE FINDINGS Alignment: There is no evidence spondylolisthesis. Skull base and vertebrae: Skull base and craniocervical junction regions appear normal. No evident fracture. No blastic or lytic bone lesions. Soft tissues and spinal canal: Prevertebral soft tissues and predental space regions are normal. There is no cord or canal hematoma. No paraspinous lesions are evident. Disc levels: Disc spaces appear normal. There is no appreciable facet arthropathy. No nerve root edema or effacement. No disc extrusion or stenosis. Upper chest: Visualized upper lung regions are clear. Other: None  IMPRESSION: CT head: Foci of paranasal sinus disease. Study otherwise unremarkable. CT cervical spine: No fracture or spondylolisthesis. No evident arthropathy. No disc extrusion or stenosis. Electronically Signed   By: Bretta Bang III M.D.   On: 01/29/2020 14:51   CT Cervical Spine Wo Contrast  Result Date: 01/29/2020 CLINICAL DATA:  Pain following fall EXAM: CT HEAD WITHOUT CONTRAST CT CERVICAL SPINE WITHOUT CONTRAST TECHNIQUE: Multidetector CT imaging of the head and cervical spine was performed following the standard protocol without intravenous contrast. Multiplanar CT image reconstructions of the cervical spine were also generated. COMPARISON:  CT head and CT cervical spine November 07, 2014 FINDINGS: CT HEAD FINDINGS Brain: The ventricles and sulci are normal in size and configuration. There is no intracranial mass, hemorrhage, extra-axial fluid collection, or midline shift. The brain parenchyma appears unremarkable. No acute infarct evident. Vascular: No hyperdense vessel.  No vascular calcification evident. Skull: The bony calvarium appears intact. Sinuses/Orbits: There is mucosal thickening in several ethmoid air cells. Orbits appear symmetric bilaterally. Other: Mastoid air cells are clear. CT CERVICAL SPINE FINDINGS Alignment: There is no evidence spondylolisthesis. Skull base and vertebrae: Skull base and craniocervical junction regions appear normal. No evident fracture. No blastic or lytic bone lesions. Soft tissues and spinal canal: Prevertebral soft tissues and predental space regions are normal. There is no cord or canal hematoma. No paraspinous lesions are evident. Disc levels: Disc spaces appear normal. There is no appreciable facet arthropathy. No nerve root edema or effacement. No disc extrusion or stenosis. Upper chest: Visualized upper lung regions are clear. Other: None IMPRESSION: CT head: Foci of paranasal sinus disease. Study otherwise unremarkable. CT cervical spine: No  fracture or spondylolisthesis. No evident arthropathy. No disc extrusion or stenosis. Electronically Signed   By: Bretta Bang III M.D.   On: 01/29/2020 14:51    ____________________________________________   PROCEDURES  Procedure(s) performed (including Critical Care):  Procedures   ____________________________________________   INITIAL IMPRESSION / ASSESSMENT AND PLAN / ED COURSE  As part of my medical decision making, I reviewed the following data within the electronic MEDICAL RECORD NUMBER     Patient presents with headache, neck pain, back pain, left elbow pain secondary to a fall last night.  Discussed no acute findings on CT of the head and neck.  X-ray of the lumbar spine and left elbow was unremarkable.  Patient complaint physical exam is consistent with postconcussion and musculoskeletal pain secondary to fall.  Patient given discharge care instruction work note.  Patient advised take only Tylenol for headache.  Patient advised return back to ED if condition worsens in the next 3 to 5 days.   Yaeli Hartung Turvey was evaluated in Emergency Department on 01/29/2020 for the symptoms described in the history of present illness. She was evaluated in the context of the global COVID-19  pandemic, which necessitated consideration that the patient might be at risk for infection with the SARS-CoV-2 virus that causes COVID-19. Institutional protocols and algorithms that pertain to the evaluation of patients at risk for COVID-19 are in a state of rapid change based on information released by regulatory bodies including the CDC and federal and state organizations. These policies and algorithms were followed during the patient's care in the ED.       ____________________________________________   FINAL CLINICAL IMPRESSION(S) / ED DIAGNOSES  Final diagnoses:  Postconcussion syndrome  Fall, initial encounter     ED Discharge Orders    None       Note:  This document was  prepared using Dragon voice recognition software and may include unintentional dictation errors.    Sable Feil, PA-C 01/29/20 1521    Lavonia Drafts, MD 01/29/20 1525

## 2020-01-29 NOTE — Discharge Instructions (Signed)
For discharge care instruction advised only Tylenol for headache.

## 2020-01-29 NOTE — ED Notes (Signed)
See triage note. Pt to ED due to sustained fall from walking dog. Pt c/o lefy sided back pain, posterior head pain, and left elbow pain that radiates down to her wrist.

## 2020-03-21 ENCOUNTER — Emergency Department
Admission: EM | Admit: 2020-03-21 | Discharge: 2020-03-21 | Disposition: A | Discharge status: Home / Self Care | Payer: BLUE CROSS/BLUE SHIELD | Attending: Emergency Medicine | Admitting: Emergency Medicine

## 2020-03-21 ENCOUNTER — Emergency Department: Payer: BLUE CROSS/BLUE SHIELD

## 2020-03-21 ENCOUNTER — Other Ambulatory Visit: Payer: Self-pay

## 2020-03-21 DIAGNOSIS — M25571 Pain in right ankle and joints of right foot: Secondary | ICD-10-CM | POA: Insufficient documentation

## 2020-03-21 DIAGNOSIS — Y9339 Activity, other involving climbing, rappelling and jumping off: Secondary | ICD-10-CM | POA: Diagnosis not present

## 2020-03-21 DIAGNOSIS — Y929 Unspecified place or not applicable: Secondary | ICD-10-CM | POA: Diagnosis not present

## 2020-03-21 DIAGNOSIS — Y999 Unspecified external cause status: Secondary | ICD-10-CM | POA: Diagnosis not present

## 2020-03-21 DIAGNOSIS — W010XXA Fall on same level from slipping, tripping and stumbling without subsequent striking against object, initial encounter: Secondary | ICD-10-CM | POA: Insufficient documentation

## 2020-03-21 MED ORDER — IBUPROFEN 600 MG PO TABS: 600.0000 mg | ORAL_TABLET | Freq: Three times a day (TID) | ORAL | 0 refills | Status: DC | PRN

## 2020-03-21 MED ORDER — TRAMADOL HCL 50 MG PO TABS: 50.0000 mg | ORAL_TABLET | Freq: Four times a day (QID) | ORAL | 0 refills | Status: AC | PRN

## 2020-03-21 MED ORDER — CYCLOBENZAPRINE HCL 10 MG PO TABS: 10.0000 mg | ORAL_TABLET | Freq: Three times a day (TID) | ORAL | 0 refills | Status: AC | PRN

## 2020-03-21 MED ORDER — NAPROXEN 500 MG PO TABS
500.0000 mg | ORAL_TABLET | Freq: Once | ORAL | Status: AC
  Administered 2020-03-21: 500 mg via ORAL
  Filled 2020-03-21: qty 1

## 2020-03-21 NOTE — ED Provider Notes (Signed)
[facility] Emergency Department Provider Note   ____________________________________________   First MD Initiated Contact with Patient 03/21/20 1122     (approximate)  I have reviewed the triage vital signs and the nursing notes.   HISTORY  Chief Complaint Fall    HPI Alexis [name] is a 34 y.o. female presents with right ankle pain edema secondary to a trip and near fall.  Patient that she was jumping rope when her cat rubbed against her leg and she tripped trying to avoid stepping on the cat.  Incident occurred prior to arrival.  Patient did pain increased with weightbearing.  Patient states pain is a 1/10 of nonweightbearing increases to 5/10 with weightbearing.  Ice pack applied in triage for swelling.         Past Medical History:  Diagnosis Date   Anemia    Hypoglycemia    Irritable bowel syndrome    Kidney stones     There are no problems to display for this patient.   Past Surgical History:  Procedure Laterality Date   CHOLECYSTECTOMY     TUBAL LIGATION      Prior to Admission medications   Medication Sig Start Date End Date Taking? Authorizing Provider  cyclobenzaprine (FLEXERIL) 10 MG tablet Take 1 tablet (10 mg total) by mouth 3 (three) times daily as needed. 08/04/19   Joni Reining, PA-C  cyclobenzaprine (FLEXERIL) 10 MG tablet Take 1 tablet (10 mg total) by mouth 3 (three) times daily as needed. 08/04/19   Fisher, Roselyn Bering, PA-C  ibuprofen (ADVIL) 600 MG tablet Take 1 tablet (600 mg total) by mouth every 8 (eight) hours as needed. 08/04/19   Joni Reining, PA-C  ibuprofen (ADVIL) 600 MG tablet Take 1 tablet (600 mg total) by mouth every 6 (six) hours as needed. 08/04/19   Fisher, Roselyn Bering, PA-C  ibuprofen (ADVIL) 600 MG tablet Take 1 tablet (600 mg total) by mouth every 8 (eight) hours as needed. 03/21/20   Joni Reining, PA-C  traMADol (ULTRAM) 50 MG tablet Take 1 tablet (50 mg total) by mouth every 6 (six)  hours as needed. 08/04/19 08/03/20  Joni Reining, PA-C  traMADol (ULTRAM) 50 MG tablet Take 1 tablet (50 mg total) by mouth every 6 (six) hours as needed. 08/04/19   Fisher, Roselyn Bering, PA-C  traMADol (ULTRAM) 50 MG tablet Take 1 tablet (50 mg total) by mouth every 6 (six) hours as needed for moderate pain. 03/21/20   Joni Reining, PA-C    Allergies Morphine and related  Family History  Problem Relation Age of Onset   Diabetes Father    Cancer Maternal Aunt    Cancer Paternal Aunt    Breast cancer Maternal Grandmother    Diabetes Paternal Grandfather     Social History Social History   Tobacco Use   Smoking status: Never Smoker   Smokeless tobacco: Never Used  Building services engineer Use: Never used  Substance Use Topics   Alcohol use: Not Currently    Comment: rarely   Drug use: No    Review of Systems  Constitutional: No fever/chills Eyes: No visual changes. ENT: No sore throat. Cardiovascular: Denies chest pain. Respiratory: Denies shortness of breath. Gastrointestinal: No abdominal pain.  No nausea, no vomiting.  No diarrhea.  No constipation. Genitourinary: Negative for dysuria. Musculoskeletal: Right ankle pain. Skin: Negative for rash. Neurological: Negative for headaches, focal weakness or numbness. Allergic/Immunilogical: Morphine. ____________________________________________   PHYSICAL EXAM:  VITAL SIGNS: ED Triage Vitals  Enc Vitals Group     BP 03/21/20 1107 (!) 117/50     Pulse Rate 03/21/20 1105 68     Resp 03/21/20 1105 18     Temp 03/21/20 1105 98 F (36.7 C)     Temp Source 03/21/20 1105 Oral     SpO2 03/21/20 1105 98 %     Weight 03/21/20 1106 215 lb (97.5 kg)     Height 03/21/20 1106 5\' 9"  (1.753 m)     Head Circumference --      Peak Flow --      Pain Score 03/21/20 1106 1     Pain Loc --      Pain Edu? --      Excl. in GC? --     Constitutional: Alert and oriented. Well appearing and in no acute distress.  BMI  31.75. Cardiovascular: Normal rate, regular rhythm. Grossly normal heart sounds.  Good peripheral circulation. Respiratory: Normal respiratory effort.  No retractions. Lungs CTAB. Musculoskeletal: No obvious deformity or edema to the lateral ankle.  Patient moderate guarding palpation of the medial malleolus.  Pain with ambulation. Neurologic:  Normal speech and language. No gross focal neurologic deficits are appreciated. No gait instability. Skin:  Skin is warm, dry and intact. No rash noted.  No abrasion or ecchymosis. Psychiatric: Mood and affect are normal. Speech and behavior are normal.  ____________________________________________   LABS (all labs ordered are listed, but only abnormal results are displayed)  Labs Reviewed - No data to display ____________________________________________  EKG   ____________________________________________  RADIOLOGY  ED MD interpretation:    Official radiology report(s): DG Ankle Complete Right  Result Date: 03/21/2020 CLINICAL DATA:  Right ankle pain after tripping. EXAM: RIGHT ANKLE - COMPLETE 3+ VIEW COMPARISON:  None. FINDINGS: There is no evidence of fracture, dislocation, or joint effusion. There is no evidence of arthropathy or other focal bone abnormality. Soft tissues are unremarkable. IMPRESSION: Negative. Electronically Signed   By: 05/22/2020 M.D.   On: 03/21/2020 12:06    ____________________________________________   PROCEDURES  Procedure(s) performed (including Critical Care):  Procedures   ____________________________________________   INITIAL IMPRESSION / ASSESSMENT AND PLAN / ED COURSE  As part of my medical decision making, I reviewed the following data within the electronic MEDICAL RECORD NUMBER     Patient presents her right ankle pain secondary to twisting incident and near fall.  Discussed no acute findings on x-ray with patient.  Patient complaint physical exam consistent with sprain ankle.  Patient  placed in a splint and given crutches to assist with ambulation.  Patient given a work note for 2 days and advised to follow-up with PCP.    Alexis [name] was evaluated in Emergency Department on 03/21/2020 for the symptoms described in the history of present illness. She was evaluated in the context of the global COVID-19 pandemic, which necessitated consideration that the patient might be at risk for infection with the SARS-CoV-2 virus that causes COVID-19. Institutional protocols and algorithms that pertain to the evaluation of patients at risk for COVID-19 are in a state of rapid change based on information released by regulatory bodies including the CDC and federal and state organizations. These policies and algorithms were followed during the patient's care in the ED.       ____________________________________________   FINAL CLINICAL IMPRESSION(S) / ED DIAGNOSES  Final diagnoses:  Acute right ankle pain     ED Discharge Orders  Ordered    traMADol (ULTRAM) 50 MG tablet  Every 6 hours PRN     Discontinue  Reprint     03/21/20 1255    ibuprofen (ADVIL) 600 MG tablet  Every 8 hours PRN     Discontinue  Reprint     03/21/20 1255           Note:  This document was prepared using Dragon voice recognition software and may include unintentional dictation errors.    Joni Reining, PA-C 03/21/20 1300    Shaune Pollack, MD 03/22/20 [phone]

## 2020-03-21 NOTE — Discharge Instructions (Signed)
Follow discharge care instructions, wear ankle splint and ambulate with crutches for 2 to 3 days as needed.

## 2020-03-21 NOTE — ED Notes (Signed)
Pt reports "falling wrong" on right ankle trying to overstep her cat this morning. She reports pain with bearing weight and ambulating, while resting reports pain 3/10 that is like a dull throb. Swelling and redness to ankle noted. She is provided an ice pack for comfort.

## 2020-03-21 NOTE — ED Triage Notes (Signed)
Pt states she tripped over her cat and is having right ankle and foot pain.

## 2020-03-25 ENCOUNTER — Ambulatory Visit
Admission: EM | Admit: 2020-03-25 | Discharge: 2020-03-25 | Disposition: A | Discharge status: Home / Self Care | Payer: BLUE CROSS/BLUE SHIELD | Attending: Emergency Medicine | Admitting: Emergency Medicine

## 2020-03-25 ENCOUNTER — Ambulatory Visit (INDEPENDENT_AMBULATORY_CARE_PROVIDER_SITE_OTHER): Payer: BLUE CROSS/BLUE SHIELD

## 2020-03-25 ENCOUNTER — Other Ambulatory Visit: Payer: Self-pay

## 2020-03-25 DIAGNOSIS — S93601A Unspecified sprain of right foot, initial encounter: Secondary | ICD-10-CM

## 2020-03-25 NOTE — ED Provider Notes (Signed)
HPI  SUBJECTIVE:  Alexis [name] is a 34 y.o. female who presents with medial right foot pain after tripping over her cat 4 days ago.  She had ankle swelling, was seen in the ED, ankle x-ray was negative.  She was thought to have an ankle sprain and was placed on crutches/brace.  Pain is now located along the medial foot/arch.  She reports tingling in her foot when she walks.  There is no other numbness or tingling.  She states that she feels a "pulling sensation" along the top of her foot and stabbing pain in the arch of her foot.  She reports swelling in her ankle, but this is getting better.  She reports pain in her foot with weightbearing.  She denies bruising of her foot.  No color changes.  Comes in today because she was unable to work secondary to the foot pain.  She has tried rest, elevation, ice, crutches, brace, ibuprofen 600 mg, tramadol at night.  The rest, elevation and ice helped the most.  Symptoms are worse with weightbearing, dorsiflexion, inversion/eversion.  Past medical history negative for diabetes, neuropathy, smoking.  LMP: 7/1.  Denies possibility being pregnant.  [facility]   Past Medical History:  Diagnosis Date  . Anemia   . Hypoglycemia   . Irritable bowel syndrome   . Kidney stones     Past Surgical History:  Procedure Laterality Date  . CHOLECYSTECTOMY    . TUBAL LIGATION      Family History  Problem Relation Age of Onset  . Diabetes Father   . Cancer Maternal Aunt   . Cancer Paternal Aunt   . Breast cancer Maternal Grandmother   . Diabetes Paternal Grandfather     Social History   Tobacco Use  . Smoking status: Never Smoker  . Smokeless tobacco: Never Used  Vaping Use  . Vaping Use: Never used  Substance Use Topics  . Alcohol use: Not Currently    Comment: rarely  . Drug use: No    No current facility-administered medications for this encounter.  Current Outpatient Medications:  .  cyclobenzaprine (FLEXERIL) 10 MG  tablet, Take 1 tablet (10 mg total) by mouth 3 (three) times daily as needed., Disp: 15 tablet, Rfl: 0 .  ibuprofen (ADVIL) 600 MG tablet, Take 1 tablet (600 mg total) by mouth every 8 (eight) hours as needed., Disp: 15 tablet, Rfl: 0 .  traMADol (ULTRAM) 50 MG tablet, Take 1 tablet (50 mg total) by mouth every 6 (six) hours as needed for moderate pain., Disp: 12 tablet, Rfl: 0  Allergies  Allergen Reactions  . Morphine And Related Swelling     ROS  As noted in HPI.   Physical Exam  BP (!) 125/56 (BP Location: Left Arm)   Pulse 64   Temp 98.2 F (36.8 C) (Oral)   Resp 18   Ht 5\' 9"  (1.753 m)   Wt 97.5 kg   LMP 03/11/2020   SpO2 100%   BMI 31.74 kg/m   Constitutional: Well developed, well nourished, no acute distress Eyes:  EOMI, conjunctiva normal bilaterally HENT: Normocephalic, atraumatic,mucus membranes moist Respiratory: Normal inspiratory effort Cardiovascular: Normal rate GI: nondistended skin: No rash, skin intact Musculoskeletal: Right midfoot NT.  Tenderness along the plantar aspect of the foot, especially at the arch.  Base of fifth metatarsal NT. No bruising. Skin intact. DP 2+. Refill less than 2 seconds. Sensation grossly intact. Patient able to move all toes actively.  Pain with inversion /  eversion,  Dorsiflexion. No pain with plantarflexion.  Distal fibula NT, Medial malleolus NT,  Deltoid ligament NT, Lateral ligaments NT, Achilles NT. Patient unable to bear weight while in department.  Neurologic: Alert & oriented x 3, no focal neuro deficits Psychiatric: Speech and behavior appropriate   ED Course   Medications - No data to display  Orders Placed This Encounter  Procedures  . DG Foot Complete Right    Standing Status:   Standing    Number of Occurrences:   1    Order Specific Question:   Reason for Exam (SYMPTOM  OR DIAGNOSIS REQUIRED)    Answer:   Injury 4 days ago, tenderness along the medial arch.  Rule out Lisfranc, metatarsal fracture  .  Apply ace wrap    Standing Status:   Standing    Number of Occurrences:   1  . Post op shoe    Standing Status:   Standing    Number of Occurrences:   1    Order Specific Question:   Laterality    Answer:   Right    No results found for this or any previous visit (from the past 24 hour(s)). DG Foot Complete Right  Result Date: 03/25/2020 CLINICAL DATA:  Right foot pain after injury 4 days ago. EXAM: RIGHT FOOT COMPLETE - 3+ VIEW COMPARISON:  March 21, 2020. FINDINGS: There is no evidence of fracture or dislocation. There is no evidence of arthropathy or other focal bone abnormality. Soft tissues are unremarkable. IMPRESSION: Negative. Electronically Signed   By: Lupita Raider M.D.   On: 03/25/2020 16:01    ED Clinical Impression  1. Sprain of right foot, initial encounter      ED Assessment/Plan  ER records and imaging reviewed. As noted in HPI   Patient's foot is tender today.  Her ankle is completely nontender, stable.  Will x-ray foot rule out Lisfranc, navicular, metatarsal fracture, calcanenonavicular rupture.  Reviewed imaging independently.  Normal foot.  See radiology report for full details.  Presentation consistent with a foot sprain.  Will add Tylenol 1000 mg to her ibuprofen.  Advised her to take the ibuprofen every 6 hours.  Tramadol as needed.  Will place in Ace wrap, stiff soled shoe.  Follow-up with podiatry if not getting better 10 days to 2 weeks after injury.  Dr. Excell Seltzer on call.  Work note for Saturday and Sunday.  She is off Monday and Tuesday.  This will give her about 10 days of rest.  She states that she got significantly better with the rest and elevation.  Discussed imaging, MDM, treatment plan, and plan for follow-up with patient.  patient agrees with plan.   No orders of the defined types were placed in this encounter.   *This clinic note was created using Dragon dictation software. Therefore, there may be occasional mistakes despite careful  proofreading.   ?    Domenick Gong, MD 03/25/20 1627

## 2020-03-25 NOTE — Discharge Instructions (Addendum)
Add 1000 mg Tylenol to the 600 mg of ibuprofen and take them together 3 or 4 times a day.  Do not take any more than 4000 mg of Tylenol in 24 hours.  Tramadol as needed for breakthrough pain.  Continue using the crutches.  Ace wrap, stiff soled shoe as needed.  Continue ice, elevation, rest.

## 2020-03-25 NOTE — ED Triage Notes (Signed)
Patient states that she tripped over her cat and injured her ankle. States that she was seen and x-rayed on Monday at [facility] ED. Reports that they placed her in a aircast and gave her crutches. States that she tried to go back to work today but cannot wear the brace with her steel toe boots. States that the swelling has been constant and painful.

## 2020-07-07 ENCOUNTER — Other Ambulatory Visit: Payer: Self-pay

## 2020-07-07 ENCOUNTER — Encounter: Payer: Self-pay | Admitting: Intensive Care

## 2020-07-07 ENCOUNTER — Emergency Department: Payer: Self-pay

## 2020-07-07 ENCOUNTER — Emergency Department
Admission: EM | Admit: 2020-07-07 | Discharge: 2020-07-07 | Disposition: A | Discharge status: Home / Self Care | Payer: Self-pay | Attending: Emergency Medicine | Admitting: Emergency Medicine

## 2020-07-07 DIAGNOSIS — M722 Plantar fascial fibromatosis: Secondary | ICD-10-CM | POA: Insufficient documentation

## 2020-07-07 MED ORDER — MELOXICAM 15 MG PO TABS: 15.0000 mg | ORAL_TABLET | Freq: Every day | ORAL | 0 refills | Status: AC

## 2020-07-07 MED ORDER — KETOROLAC TROMETHAMINE 30 MG/ML IJ SOLN
30.0000 mg | Freq: Once | INTRAMUSCULAR | Status: AC
  Administered 2020-07-07: 30 mg via INTRAMUSCULAR
  Filled 2020-07-07: qty 1

## 2020-07-07 NOTE — ED Notes (Signed)
Left foot pain that radiates up into her leg. Denies injury.

## 2020-07-07 NOTE — ED Triage Notes (Signed)
Patient c/o left heel pain that started around 3-4weeks ago. progressively gotten worse. Reports she was at work today and pain radiated from heel up to knee

## 2020-07-07 NOTE — ED Provider Notes (Signed)
[facility] Emergency Department Provider Note  ____________________________________________  Time seen: Approximately 8:32 PM  I have reviewed the triage vital signs and the nursing notes.   HISTORY  Chief Complaint Foot Pain (left)    HPI Alexis [name] is a 34 y.o. female who presents emergency department complaining of left heel pain has been increasing over the past few weeks.  Patient states that she has an area on her heel that is exquisitely tender to any pressure or weightbearing.  Initially  was minimally sore "as if I had stepped on a rock and bruised it.  "Patient states that this area has been progressively worsening in regards to the pain.  She is changed she is thinking that this could be lack of good shoes or support.  Patient states that today the pain was so bad it was radiating up her leg.  She is had no edema or erythema of the lower extremity.  Pain is absent without palpation or weightbearing.  With any direct pressure over this area the pain returns.  No history of fractures but states that she has had chronic issues with both of her feet as well as multiple ankle sprains.  No trauma precipitating patient's symptoms.  Patient denies any other complaints at this time.        Past Medical History:  Diagnosis Date  . Anemia   . Hypoglycemia   . Irritable bowel syndrome   . Kidney stones     There are no problems to display for this patient.   Past Surgical History:  Procedure Laterality Date  . CHOLECYSTECTOMY    . TUBAL LIGATION      Prior to Admission medications   Medication Sig Start Date End Date Taking? Authorizing Provider  cyclobenzaprine (FLEXERIL) 10 MG tablet Take 1 tablet (10 mg total) by mouth 3 (three) times daily as needed. 03/21/20   Joni Reining, PA-C  ibuprofen (ADVIL) 600 MG tablet Take 1 tablet (600 mg total) by mouth every 8 (eight) hours as needed. 03/21/20   Joni Reining, PA-C  meloxicam (MOBIC) 15  MG tablet Take 1 tablet (15 mg total) by mouth daily. 07/07/20   Romello Hoehn, Delorise Royals, PA-C  traMADol (ULTRAM) 50 MG tablet Take 1 tablet (50 mg total) by mouth every 6 (six) hours as needed for moderate pain. 03/21/20   Joni Reining, PA-C    Allergies Morphine and related  Family History  Problem Relation Age of Onset  . Diabetes Father   . Cancer Maternal Aunt   . Cancer Paternal Aunt   . Breast cancer Maternal Grandmother   . Diabetes Paternal Grandfather     Social History Social History   Tobacco Use  . Smoking status: Never Smoker  . Smokeless tobacco: Never Used  Vaping Use  . Vaping Use: Never used  Substance Use Topics  . Alcohol use: Yes    Comment: rarely  . Drug use: No     Review of Systems  Constitutional: No fever/chills Eyes: No visual changes. No discharge ENT: No upper respiratory complaints. Cardiovascular: no chest pain. Respiratory: no cough. No SOB. Gastrointestinal: No abdominal pain.  No nausea, no vomiting.  No diarrhea.  No constipation. Musculoskeletal: Positive for left heel pain Skin: Negative for rash, abrasions, lacerations, ecchymosis. Neurological: Negative for headaches, focal weakness or numbness.  10 System ROS otherwise negative.  ____________________________________________   PHYSICAL EXAM:  VITAL SIGNS: ED Triage Vitals  Enc Vitals Group  BP 07/07/20 1849 (!) 125/55     Pulse Rate 07/07/20 1849 77     Resp 07/07/20 1849 16     Temp 07/07/20 1849 98.2 F (36.8 C)     Temp Source 07/07/20 1849 Oral     SpO2 07/07/20 1849 99 %     Weight 07/07/20 1851 220 lb (99.8 kg)     Height 07/07/20 1851 5\' 9"  (1.753 m)     Head Circumference --      Peak Flow --      Pain Score 07/07/20 1850 3     Pain Loc --      Pain Edu? --      Excl. in GC? --      Constitutional: Alert and oriented. Well appearing and in no acute distress. Eyes: Conjunctivae are normal. PERRL. EOMI. Head: Atraumatic. ENT:      Ears:        Nose: No congestion/rhinnorhea.      Mouth/Throat: Mucous membranes are moist.  Neck: No stridor.    Cardiovascular: Normal rate, regular rhythm. Normal S1 and S2.  Good peripheral circulation. Respiratory: Normal respiratory effort without tachypnea or retractions. Lungs CTAB. Good air entry to the bases with no decreased or absent breath sounds. Musculoskeletal: Full range of motion to all extremities. No gross deformities appreciated.  Visualization of the left foot/ankle reveals no erythema, edema or deformity.  Good range of motion.  Patient is tender to palpation over the plantar aspect of the left heel.  There is no other tenderness to palpation.  Palpation along the Achilles tendon reveals no palpable deficits or tenderness.  There is no erythema or edema of the lower extremity.  No calf tenderness to palpation.  Dorsalis pedis pulses sensation intact all digits. Neurologic:  Normal speech and language. No gross focal neurologic deficits are appreciated.  Skin:  Skin is warm, dry and intact. No rash noted. Psychiatric: Mood and affect are normal. Speech and behavior are normal. Patient exhibits appropriate insight and judgement.   ____________________________________________   LABS (all labs ordered are listed, but only abnormal results are displayed)  Labs Reviewed - No data to display ____________________________________________  EKG   ____________________________________________  RADIOLOGY I personally viewed and evaluated these images as part of my medical decision making, as well as reviewing the written report by the radiologist.  ED Provider Interpretation: I concur with radiologist finding with no acute abnormality to include fractures or osseous spurs  DG Foot Complete Left  Result Date: 07/07/2020 CLINICAL DATA:  Heel pain x3 weeks. EXAM: LEFT FOOT - COMPLETE 3+ VIEW COMPARISON:  None. FINDINGS: There is no evidence of fracture or dislocation. There is no evidence of  arthropathy or other focal bone abnormality. Soft tissues are unremarkable. IMPRESSION: Negative. Electronically Signed   By: 07/09/2020 M.D.   On: 07/07/2020 21:46    ____________________________________________    PROCEDURES  Procedure(s) performed:    Procedures    Medications  ketorolac (TORADOL) 30 MG/ML injection 30 mg (30 mg Intramuscular Given 07/07/20 2216)     ____________________________________________   INITIAL IMPRESSION / ASSESSMENT AND PLAN / ED COURSE  Pertinent labs & imaging results that were available during my care of the patient were reviewed by me and considered in my medical decision making (see chart for details).  Review of the [place] CSRS was performed in accordance of the [facility] prior to dispensing any controlled drugs.           Patient's diagnosis is  consistent with plantar fasciitis.  Patient presented to emergency department sharp heel pain that has been worsening over the past several months.  Patient spends prolonged times of her workday on her feet or walking.  Patient attempted to change shoes, use over-the-counter medications with no relief.  Patient has had multiple ongoing issues with her feet.  In the last 6 months she has been seen twice in the emergency department or urgent care for the complaint of foot pain or injury.  She was seen once for sprain of the right foot, once for right ankle sprain.  Today, imaging revealed no stress fractures or calcaneal spurs.At this time I have recommended a postop shoe, anti-inflammatories and follow-up with podiatry for her ongoing issues with her feet.  Patient has a postop shoe from a previous remote injury to the right foot.  Patient will use the postop shoe at home.  Patient will be placed on meloxicam and referred to podiatry for further management.  Differential included plantar fasciitis, calcaneal spur, stress fracture.  Patient is given ED precautions to return to the ED for any worsening or  new symptoms.     ____________________________________________  FINAL CLINICAL IMPRESSION(S) / ED DIAGNOSES  Final diagnoses:  Plantar fasciitis of left foot      NEW MEDICATIONS STARTED DURING THIS VISIT:  ED Discharge Orders         Ordered    meloxicam (MOBIC) 15 MG tablet  Daily        07/07/20 2207              This chart was dictated using voice recognition software/Dragon. Despite best efforts to proofread, errors can occur which can change the meaning. Any change was purely unintentional.    Racheal Patches, PA-C 07/07/20 2354    Delton Prairie, MD 07/07/20 2358

## 2020-12-16 ENCOUNTER — Other Ambulatory Visit: Payer: Self-pay

## 2020-12-16 ENCOUNTER — Emergency Department
Admission: EM | Admit: 2020-12-16 | Discharge: 2020-12-17 | Disposition: A | Discharge status: Home / Self Care | Payer: BLUE CROSS/BLUE SHIELD | Attending: Emergency Medicine | Admitting: Emergency Medicine

## 2020-12-16 ENCOUNTER — Emergency Department: Payer: BLUE CROSS/BLUE SHIELD

## 2020-12-16 DIAGNOSIS — R110 Nausea: Secondary | ICD-10-CM | POA: Diagnosis not present

## 2020-12-16 DIAGNOSIS — R109 Unspecified abdominal pain: Secondary | ICD-10-CM

## 2020-12-16 DIAGNOSIS — R300 Dysuria: Secondary | ICD-10-CM | POA: Diagnosis not present

## 2020-12-16 DIAGNOSIS — R39198 Other difficulties with micturition: Secondary | ICD-10-CM | POA: Insufficient documentation

## 2020-12-16 DIAGNOSIS — R1032 Left lower quadrant pain: Secondary | ICD-10-CM

## 2020-12-16 LAB — BASIC METABOLIC PANEL
Anion gap: 8 (ref 5–15)
BUN: 17 mg/dL (ref 6–20)
CO2: 25 mmol/L (ref 22–32)
Calcium: 9.1 mg/dL (ref 8.9–10.3)
Chloride: 104 mmol/L (ref 98–111)
Creatinine, Ser: 0.73 mg/dL (ref 0.44–1.00)
GFR, Estimated: >60 mL/min (ref >60)
Glucose, Bld: 88 mg/dL (ref 70–99)
Potassium: 3.9 mmol/L (ref 3.5–5.1)
Sodium: 137 mmol/L (ref 135–145)

## 2020-12-16 LAB — CBC
HCT: 38 % (ref 36.0–46.0)
Hemoglobin: 12.2 g/dL (ref 12.0–15.0)
MCH: 27.8 pg (ref 26.0–34.0)
MCHC: 32.1 g/dL (ref 30.0–36.0)
MCV: 86.6 fL (ref 80.0–100.0)
Platelets: 253 10*3/uL (ref 150–400)
RBC: 4.39 MIL/uL (ref 3.87–5.11)
RDW: 12.4 % (ref 11.5–15.5)
WBC: 9.5 10*3/uL (ref 4.0–10.5)
nRBC: 0 % (ref 0.0–0.2)

## 2020-12-16 LAB — URINALYSIS, COMPLETE (UACMP) WITH MICROSCOPIC
Bacteria, UA: NONE SEEN
Bilirubin Urine: NEGATIVE
Glucose, UA: NEGATIVE mg/dL
Hgb urine dipstick: NEGATIVE
Ketones, ur: NEGATIVE mg/dL
Leukocytes,Ua: NEGATIVE
Nitrite: NEGATIVE
Protein, ur: NEGATIVE mg/dL
Specific Gravity, Urine: 1.026 (ref 1.005–1.030)
pH: 5 (ref 5.0–8.0)

## 2020-12-16 LAB — LIPASE, BLOOD: Lipase: 46 U/L (ref 11–51)

## 2020-12-16 LAB — WET PREP, GENITAL
Clue Cells Wet Prep HPF POC: NONE SEEN
Sperm: NONE SEEN
Trich, Wet Prep: NONE SEEN
Yeast Wet Prep HPF POC: NONE SEEN

## 2020-12-16 LAB — POC URINE PREG, ED: Preg Test, Ur: NEGATIVE

## 2020-12-16 MED ORDER — PHENAZOPYRIDINE HCL 100 MG PO TABS: 100.0000 mg | ORAL_TABLET | Freq: Three times a day (TID) | ORAL | 0 refills | Status: AC | PRN

## 2020-12-16 MED ORDER — ONDANSETRON 4 MG PO TBDP: 4.0000 mg | ORAL_TABLET | Freq: Three times a day (TID) | ORAL | 0 refills | Status: AC | PRN

## 2020-12-16 MED ORDER — DICYCLOMINE HCL 10 MG PO CAPS: 10.0000 mg | ORAL_CAPSULE | Freq: Four times a day (QID) | ORAL | 0 refills | Status: AC

## 2020-12-16 MED ORDER — DICYCLOMINE HCL 10 MG PO CAPS
10.0000 mg | ORAL_CAPSULE | Freq: Once | ORAL | Status: AC
  Administered 2020-12-17: 10 mg via ORAL
  Filled 2020-12-16: qty 1

## 2020-12-16 MED ORDER — KETOROLAC TROMETHAMINE 10 MG PO TABS: 10.0000 mg | ORAL_TABLET | Freq: Four times a day (QID) | ORAL | 0 refills | Status: AC | PRN

## 2020-12-16 MED ORDER — KETOROLAC TROMETHAMINE 60 MG/2ML IM SOLN
30.0000 mg | Freq: Once | INTRAMUSCULAR | Status: AC
  Administered 2020-12-17: 30 mg via INTRAMUSCULAR
  Filled 2020-12-16: qty 2

## 2020-12-16 MED ORDER — ONDANSETRON 4 MG PO TBDP
4.0000 mg | ORAL_TABLET | Freq: Once | ORAL | Status: AC
  Administered 2020-12-16: 4 mg via ORAL

## 2020-12-16 MED ORDER — ACETAMINOPHEN 325 MG PO TABS
650.0000 mg | ORAL_TABLET | Freq: Once | ORAL | Status: AC
  Administered 2020-12-17: 650 mg via ORAL
  Filled 2020-12-16: qty 2

## 2020-12-16 MED ORDER — OXYCODONE-ACETAMINOPHEN 5-325 MG PO TABS
1.0000 | ORAL_TABLET | Freq: Once | ORAL | Status: AC
  Administered 2020-12-16: 1 via ORAL
  Filled 2020-12-16: qty 1

## 2020-12-16 MED ORDER — ONDANSETRON 4 MG PO TBDP
ORAL_TABLET | ORAL | Status: AC
  Filled 2020-12-16: qty 1

## 2020-12-16 NOTE — ED Provider Notes (Signed)
[facility] Emergency Department Provider Note  ____________________________________________   Event Date/Time   First MD Initiated Contact with Patient 12/16/20 2047     (approximate)  I have reviewed the triage vital signs and the nursing notes.   HISTORY  Chief Complaint Flank Pain  HPI Alexis [name] is a 35 y.o. female with past medical history significant for large ovarian cyst and kidney stones who reports to the emergency department for evaluation of left lower quadrant abdominal pain as well as left flank pain.  Patient states that symptoms began last night with one episode of left lower quadrant pain that subsided and the patient went to sleep.  When she awoke, she had mild residual, but noted that it was significantly improved, took a Tylenol and went to work.  She noted that throughout the time at work, it began to worsen.  It then developed to spreading to the left flank area.  She reports associated dysuria and difficulty urinating.  She also endorses nausea with no vomiting.  She reports last bowel movement was this morning, and normal in consistency and color.  She denies any fevers, chills, vaginal discharge, concern for STDs.  She reports that she just finished her menstrual cycle last week does not believe that she could be pregnant.         Past Medical History:  Diagnosis Date  . Anemia   . Hypoglycemia   . Irritable bowel syndrome   . Kidney stones     There are no problems to display for this patient.   Past Surgical History:  Procedure Laterality Date  . CHOLECYSTECTOMY    . TUBAL LIGATION      Prior to Admission medications   Medication Sig Start Date End Date Taking? Authorizing Provider  dicyclomine (BENTYL) 10 MG capsule Take 1 capsule (10 mg total) by mouth 4 (four) times daily for 14 days. 12/16/20 12/30/20 Yes Ameerah Huffstetler, Ruben Gottron, PA  ketorolac (TORADOL) 10 MG tablet Take 1 tablet (10 mg total) by mouth every 6  (six) hours as needed for up to 5 days. 12/16/20 12/21/20 Yes Deryl Giroux, Ruben Gottron, PA  ondansetron (ZOFRAN ODT) 4 MG disintegrating tablet Take 1 tablet (4 mg total) by mouth every 8 (eight) hours as needed for nausea or vomiting. 12/16/20  Yes Lucy Chris, PA  phenazopyridine (PYRIDIUM) 100 MG tablet Take 1 tablet (100 mg total) by mouth 3 (three) times daily as needed for pain. 12/16/20 12/16/21 Yes Addison Whidbee, Ruben Gottron, PA  cyclobenzaprine (FLEXERIL) 10 MG tablet Take 1 tablet (10 mg total) by mouth 3 (three) times daily as needed. 03/21/20   Joni Reining, PA-C  meloxicam (MOBIC) 15 MG tablet Take 1 tablet (15 mg total) by mouth daily. 07/07/20   Cuthriell, Delorise Royals, PA-C  traMADol (ULTRAM) 50 MG tablet Take 1 tablet (50 mg total) by mouth every 6 (six) hours as needed for moderate pain. 03/21/20   Joni Reining, PA-C    Allergies Morphine and related  Family History  Problem Relation Age of Onset  . Diabetes Father   . Cancer Maternal Aunt   . Cancer Paternal Aunt   . Breast cancer Maternal Grandmother   . Diabetes Paternal Grandfather     Social History Social History   Tobacco Use  . Smoking status: Never Smoker  . Smokeless tobacco: Never Used  Vaping Use  . Vaping Use: Never used  Substance Use Topics  . Alcohol use: Yes    Comment:  rarely  . Drug use: No    Review of Systems Constitutional: No fever/chills Eyes: No visual changes. ENT: No sore throat. Cardiovascular: Denies chest pain. Respiratory: Denies shortness of breath. Gastrointestinal: + abdominal pain.  + nausea, no vomiting.  No diarrhea.  No constipation. + Left flank pain. Genitourinary: Negative for dysuria. Musculoskeletal: Negative for back pain. Skin: Negative for rash. Neurological: Negative for headaches, focal weakness or numbness.  ____________________________________________   PHYSICAL EXAM:  VITAL SIGNS: ED Triage Vitals  Enc Vitals Group     BP 12/16/20 1957 116/73     Pulse Rate  12/16/20 1957 64     Resp 12/16/20 1957 18     Temp 12/16/20 1957 98.5 F (36.9 C)     Temp Source 12/16/20 1957 Oral     SpO2 12/16/20 1956 97 %     Weight 12/16/20 1957 217 lb (98.4 kg)     Height 12/16/20 1957 5\' 8"  (1.727 m)     Head Circumference --      Peak Flow --      Pain Score 12/16/20 1957 7     Pain Loc --      Pain Edu? --      Excl. in GC? --    Constitutional: Alert and oriented. Well appearing and in no acute distress. Eyes: Conjunctivae are normal. PERRL. EOMI. Head: Atraumatic. Nose: No congestion/rhinnorhea. Mouth/Throat: Mucous membranes are moist.  Oropharynx non-erythematous. Neck: No stridor.   Cardiovascular: Normal rate, regular rhythm. Grossly normal heart sounds.  Good peripheral circulation. Respiratory: Normal respiratory effort.  No retractions. Lungs CTAB. Gastrointestinal: Tenderness in the left lower quadrant with no rebound or guarding.  Bowel sounds x4 quadrants. No distention. No abdominal bruits. + Left-sided CVA tenderness, no right-sided CVA tenderness.. Genitourinary: Normal external female genitalia.  Pelvic exam reveals normal vaginal mucosa with mild amount of physiologic appearing discharge.  No adnexal tenderness on bimanual exam, no chandelier sign. Musculoskeletal: No lower extremity tenderness nor edema.  No joint effusions. Neurologic:  Normal speech and language. No gross focal neurologic deficits are appreciated. No gait instability. Skin:  Skin is warm, dry and intact. No rash noted. Psychiatric: Mood and affect are normal. Speech and behavior are normal.  ____________________________________________   LABS (all labs ordered are listed, but only abnormal results are displayed)  Labs Reviewed  WET PREP, GENITAL - Abnormal; Notable for the following components:      Result Value   WBC, Wet Prep HPF POC FEW (*)    All other components within normal limits  URINALYSIS, COMPLETE (UACMP) WITH MICROSCOPIC - Abnormal; Notable for  the following components:   Color, Urine YELLOW (*)    APPearance CLEAR (*)    All other components within normal limits  URINE CULTURE  CHLAMYDIA/NGC RT PCR ([facility] ONLY)  BASIC METABOLIC PANEL  CBC  LIPASE, BLOOD  POC URINE PREG, ED    ____________________________________________  RADIOLOGY  Official radiology report(s): CT Renal Stone Study  Result Date: 12/16/2020 CLINICAL DATA:  Flank pain, kidney stone suspected Left lower quadrant pain. EXAM: CT ABDOMEN AND PELVIS WITHOUT CONTRAST TECHNIQUE: Multidetector CT imaging of the abdomen and pelvis was performed following the standard protocol without IV contrast. COMPARISON:  Noncontrast CT 11/16/2019 FINDINGS: Lower chest: Stable small subpleural nodule in the right lower lobe, considered benign. No acute airspace disease or pleural effusion. Hepatobiliary: No focal liver abnormality is seen. Elongated right hepatic lobe measuring 21 cm cranial caudal. Status post cholecystectomy. No biliary dilatation. Pancreas: No  ductal dilatation or inflammation. Spleen: Normal in size without focal abnormality. Adrenals/Urinary Tract: Normal adrenal glands. No hydronephrosis, perinephric edema, or renal calculi. Both ureters are decompressed without stones along the course. Urinary bladder is minimally distended, no bladder stone or wall thickening. Stomach/Bowel: Small hiatal hernia. Unremarkable small bowel. Normal appendix. Small to moderate colonic stool burden. Vascular/Lymphatic: No aortic aneurysm. No abdominopelvic adenopathy. Reproductive: Retroverted uterus. Quiescent ovaries without adnexal mass. Other: No free air, free fluid, or intra-abdominal fluid collection. No body wall hernia. Musculoskeletal: There are no acute or suspicious osseous abnormalities. IMPRESSION: 1. No renal stones or obstructive uropathy. No acute abnormality in the abdomen/pelvis. 2. Small hiatal hernia. Electronically Signed   By: Narda Rutherford M.D.   On:  12/16/2020 21:30   US PELVIC COMPLETE W TRANSVAGINAL AND TORSION R/O  Result Date: 12/16/2020 CLINICAL DATA:  Left adnexal pain. EXAM: TRANSABDOMINAL AND TRANSVAGINAL ULTRASOUND OF PELVIS DOPPLER ULTRASOUND OF OVARIES TECHNIQUE: Both transabdominal and transvaginal ultrasound examinations of the pelvis were performed. Transabdominal technique was performed for global imaging of the pelvis including uterus, ovaries, adnexal regions, and pelvic cul-de-sac. It was necessary to proceed with endovaginal exam following the transabdominal exam to visualize the right left ovary. Color and duplex Doppler ultrasound was utilized to evaluate blood flow to the ovaries. COMPARISON:  Noncontrast CT earlier today. Pelvic ultrasound 07/16/2017 FINDINGS: Uterus Measurements: 8.0 x 4.4 x 4.4 cm = volume: 81 mL. Retroverted. No fibroids or other mass visualized. Endometrium Thickness: 6 mm, normal.  No focal abnormality visualized. Right ovary Measurements: 2.9 x 1.6 x 1.8 cm = volume: 4.5 mL. Normal appearance with normal blood flow. No cyst or adnexal mass. Left ovary Measurements: 2.6 x 1.2 x 2.1 cm = volume: 1.9 mL. Normal appearance with normal blood flow. No adnexal mass. Pulsed Doppler evaluation of both ovaries demonstrates normal low-resistance arterial and venous waveforms. Other findings No abnormal free fluid.  Prominent vessels in both adnexa. IMPRESSION: 1. Normal sonographic appearance of both ovaries without torsion, cyst or adnexal mass. 2. Retroverted uterus.  Normal endometrium. 3. Prominent vessels in both adnexa are nonspecific, but can be seen in the setting of pelvic congestion syndrome in the appropriate clinical setting. Electronically Signed   By: Narda Rutherford M.D.   On: 12/16/2020 22:19    ____________________________________________   INITIAL IMPRESSION / ASSESSMENT AND PLAN / ED COURSE  As part of my medical decision making, I reviewed the following data within the electronic MEDICAL RECORD NUMBER  Nursing notes reviewed and incorporated, Labs reviewed and Notes from prior ED visits        Patient is a 35 year old female who presents to the emergency department for evaluation of left flank pain and left lower quadrant pain.  See HPI for further details.  In triage, patient is afebrile, has normal vital signs.  On physical exam, she has tenderness to the left lower quadrant but no rebound or guarding.  She also has positive left-sided CVA tenderness.  Otherwise, physical exam is grossly unremarkable.  Laboratory evaluation began with BMP, CBC, lipase, urinalysis.  These are unremarkable.  POC urine pregnancy is negative.  Given that history of kidney stones as well as left-sided CVA tenderness, began evaluation with a CT renal stone study which was negative for any acute stones.  Given the reported history of large ovarian cyst with previous torsion rule out, ultrasound was performed and was negative for any mass, cyst or torsion.  Discussed the findings with the patient, and at this point she  agreed to chaperoned pelvic exam at which time a wet prep and GC/chlamydia swab was obtained.  Wet prep does demonstrate few white cells, however no BV, trichomoniasis or yeast present.  Given the patient's low concern for STD, will discharge her but call her tomorrow if she is positive.  In the meantime, will begin treatment for her nausea and abdominal pain with Bentyl and Zofran as well as Toradol.  Patient is amenable with this plan.  Return precautions were discussed at length, and she is agreeable to return if worsening or changes.  Patient is stable this time for outpatient follow-up.      ____________________________________________   FINAL CLINICAL IMPRESSION(S) / ED DIAGNOSES  Final diagnoses:  Left lower quadrant abdominal pain  Flank pain     ED Discharge Orders         Ordered    dicyclomine (BENTYL) 10 MG capsule  4 times daily        12/16/20 2326    ondansetron (ZOFRAN ODT) 4  MG disintegrating tablet  Every 8 hours PRN        12/16/20 2326    ketorolac (TORADOL) 10 MG tablet  Every 6 hours PRN        12/16/20 2326    phenazopyridine (PYRIDIUM) 100 MG tablet  3 times daily PRN        12/16/20 2326          *Please note:  Alexis [name] was evaluated in Emergency Department on 12/16/2020 for the symptoms described in the history of present illness. She was evaluated in the context of the global COVID-19 pandemic, which necessitated consideration that the patient might be at risk for infection with the SARS-CoV-2 virus that causes COVID-19. Institutional protocols and algorithms that pertain to the evaluation of patients at risk for COVID-19 are in a state of rapid change based on information released by regulatory bodies including the CDC and federal and state organizations. These policies and algorithms were followed during the patient's care in the ED.  Some ED evaluations and interventions may be delayed as a result of limited staffing during and the pandemic.*   Note:  This document was prepared using Dragon voice recognition software and may include unintentional dictation errors.   Lucy Chris, PA 12/16/20 1093    Sharman Cheek, MD 12/17/20 [phone]

## 2020-12-16 NOTE — Discharge Instructions (Addendum)
Please take the medications as needed and as prescribed.  Follow-up with primary care if symptoms do not improve, or return to the emergency department if symptoms worsen.  I will call you tomorrow with the results of your gonorrhea and Chlamydia test.

## 2020-12-16 NOTE — ED Triage Notes (Signed)
LLQ abd pain onset last night and relieved by this AM. Reports this afternoon pain returned and is now in L flank as well. Reports increased urinary frequency as well as painful urination. States that a home UTI test was positive for infection. Patient also reports hx of kidney stones and states this pain feels similar. Pt also endorses hx of ovarian cysts. Reports nausea without emesis.

## 2020-12-16 NOTE — ED Notes (Signed)
See triage note. Reports left flank pain since last night.

## 2020-12-17 LAB — CHLAMYDIA/NGC RT PCR (ARMC ONLY)
Chlamydia Tr: NOT DETECTED
N gonorrhoeae: NOT DETECTED

## 2020-12-17 MED ORDER — FAMOTIDINE 20 MG PO TABS
20.0000 mg | ORAL_TABLET | Freq: Once | ORAL | Status: AC
  Administered 2020-12-17: 20 mg via ORAL
  Filled 2020-12-17: qty 1

## 2020-12-18 LAB — URINE CULTURE: Culture: NO GROWTH
